# Patient Record
Sex: Female | Born: 1970 | Race: Black or African American | Hispanic: No | Marital: Single | Smoking: Never smoker
Health system: Southern US, Community
[De-identification: ages and names within clinical notes are randomized; demographics above are authoritative.]

## PROBLEM LIST (undated history)

## (undated) DIAGNOSIS — R112 Nausea with vomiting, unspecified: Secondary | ICD-10-CM

## (undated) DIAGNOSIS — Z9889 Other specified postprocedural states: Secondary | ICD-10-CM

## (undated) DIAGNOSIS — R011 Cardiac murmur, unspecified: Secondary | ICD-10-CM

## (undated) HISTORY — PX: ABDOMINAL HYSTERECTOMY: SHX81

## (undated) HISTORY — PX: CHOLECYSTECTOMY: SHX55

---

## 2000-01-31 HISTORY — PX: DILATION AND CURETTAGE OF UTERUS: SHX78

## 2000-01-31 HISTORY — PX: CHOLECYSTECTOMY, LAPAROSCOPIC: SHX56

## 2007-02-25 ENCOUNTER — Other Ambulatory Visit: Admission: RE | Admit: 2007-02-25 | Discharge: 2007-02-25 | Payer: Self-pay | Admitting: Obstetrics & Gynecology

## 2011-11-29 ENCOUNTER — Other Ambulatory Visit: Payer: Self-pay | Admitting: Obstetrics & Gynecology

## 2011-11-29 NOTE — Patient Instructions (Addendum)
22 Julie Vega  11/29/2011   Your procedure is scheduled on:   12/06/2011  Report to [facility] at  0900  AM.  Call this number if you have problems the morning of surgery: [phone]   Remember:   Do not eat food:After Midnight.  May have clear liquids:until Midnight .    Take these medicines the morning of surgery with A SIP OF WATER: none   Do not wear jewelry, make-up or nail polish.  Do not wear lotions, powders, or perfumes.   Do not shave 48 hours prior to surgery. Men may shave face and neck.  Do not bring valuables to the hospital.  Contacts, dentures or bridgework may not be worn into surgery.  Leave suitcase in the car. After surgery it may be brought to your room.  For patients admitted to the hospital, checkout time is 11:00 AM the day of discharge.   Patients discharged the day of surgery will not be allowed to drive home.  Name and phone number of your driver: family  Special Instructions: Shower using CHG 2 nights before surgery and the night before surgery.  If you shower the day of surgery use CHG.  Use special wash - you have one bottle of CHG for all showers.  You should use approximately 1/3 of the bottle for each shower.   Please read over the following fact sheets that you were given: Pain Booklet, Coughing and Deep Breathing, MRSA Information, Surgical Site Infection Prevention, Anesthesia Post-op Instructions and Care and Recovery After Surgery Supracervical Hysterectomy A supracervical hysterectomy is minimally invasive surgery to remove the top part of the uterus, but not the cervix. This surgery can be performed by making a large cut (incision) in the abdomen. It can also be done with a thin, lighted tube (laparoscope) inserted into 2 small incisions in the lower abdomen. Your fallopian tubes and ovaries can be removed (bilateral salpingo-oopherectomy) during this surgery as well. If a supracervical hysterectomy is started and it is not safe to  continue, the laparoscopic surgery will be converted to an open abdominal surgery. You will not have menstrual periods or be able to get pregnant after having this surgery. If a bilateral salpingo-oopherectomy was performed before menopause, you will go through a sudden (abrupt) menopause. This can be helped with hormone medicines. Benefits of minimally invasive surgery include:  Less pain.  Less risk of blood loss.  Less risk of infection.  Quicker return to normal activities.  Usually a 1 night stay in the hospital.  Overall patient satisfaction. LET YOUR CAREGIVER KNOW ABOUT:  Any history of abnormal Pap tests.  Allergies to food or medicine.  Medicines taken, including vitamins, herbs, eyedrops, over-the-counter medicines, and creams.  Use of steroids (by mouth or creams).  Previous problems with anesthetics or numbing medicines.  History of bleeding problems or blood clots.  Previous surgery.  Other health problems, including diabetes and kidney problems.  Any infections or colds you may have developed.  Symptoms of irregular or heavy periods, weight loss, or urinary or bowel changes. RISKS AND COMPLICATIONS   Bleeding.  Blood clots in the legs or lung.  Infection.  Injury to surrounding organs.  Problems with anesthesia.  Risk of conversion to an open abdominal incision.  Early menopause symptoms (hot flashes, night sweats, insomnia).  Additional surgery later to remove the cervix if you have problems with the cervix. BEFORE THE PROCEDURE  Ask your caregiver about changing or stopping your regular medicines.  Do not take aspirin or blood thinners (anticoagulants) for 1 week before the surgery, or as told by your caregiver.  Do not eat or drink anything for 8 hours before the surgery, or as told by your caregiver.  Quit smoking if you smoke.  Arrange for a ride home after surgery and for someone to help you at home during recovery. PROCEDURE    You will be given an antibiotic medicine.  An intravenous (IV) line will be placed in your arm. You will be given medicine to make you sleep (general anesthetic).  A gas (carbon dioxide) will be used to inflate your abdomen. This will allow your surgeon to look inside your abdomen, perform your surgery, and treat any other problems found if necessary.  Three or four small incisions (often less than  inch) will be made in your abdomen. One of these incisions will be made in the area of your belly button (navel). The laparoscope will be inserted into the incision. Your surgeon will look through the laparoscope while doing your procedure.  Other surgical instruments will be inserted through the other incisions.  The uterus will be cut into small pieces and removed through the small incisions.  Your incisions will be closed. AFTER THE PROCEDURE   The gas will be released from inside your abdomen.  You will be taken to the recovery area where a nurse will watch and check your progress. Once you are awake, stable, and taking fluids well, without other problems, you will return to your room or be allowed to go home.  There is usually minimal discomfort following the surgery because the incisions are so small.  You will be given pain medicine while you are in the hospital and for when you go home.  Try to have someone with you for the first 3 to 5 days after you go home.  Follow up with your surgeon in 2 to 4 weeks after surgery to evaluate your progress. Document Released: 07/05/2007 Document Revised: 04/10/2011 Document Reviewed: 09/02/2010 [facility] Patient Information 2013 [place], [place]. PATIENT INSTRUCTIONS POST-ANESTHESIA  IMMEDIATELY FOLLOWING SURGERY:  Do not drive or operate machinery for the first twenty four hours after surgery.  Do not make any important decisions for twenty four hours after surgery or while taking narcotic pain medications or sedatives.  If you develop  intractable nausea and vomiting or a severe headache please notify your doctor immediately.  FOLLOW-UP:  Please make an appointment with your surgeon as instructed. You do not need to follow up with anesthesia unless specifically instructed to do so.  WOUND CARE INSTRUCTIONS (if applicable):  Keep a dry clean dressing on the anesthesia/puncture wound site if there is drainage.  Once the wound has quit draining you may leave it open to air.  Generally you should leave the bandage intact for twenty four hours unless there is drainage.  If the epidural site drains for more than 36-48 hours please call the anesthesia department.  QUESTIONS?:  Please feel free to call your physician or the hospital operator if you have any questions, and they will be happy to assist you.

## 2011-11-30 ENCOUNTER — Encounter (HOSPITAL_COMMUNITY): Payer: Self-pay | Admitting: Pharmacy Technician

## 2011-11-30 ENCOUNTER — Encounter (HOSPITAL_COMMUNITY)
Admission: RE | Admit: 2011-11-30 | Discharge: 2011-11-30 | Disposition: A | Discharge status: Home / Self Care | Payer: Medicaid Other | Source: Ambulatory Visit | Attending: Obstetrics & Gynecology | Admitting: Obstetrics & Gynecology

## 2011-11-30 ENCOUNTER — Encounter (HOSPITAL_COMMUNITY): Payer: Self-pay

## 2011-11-30 DIAGNOSIS — Z01812 Encounter for preprocedural laboratory examination: Secondary | ICD-10-CM | POA: Insufficient documentation

## 2011-11-30 DIAGNOSIS — Z5320 Procedure and treatment not carried out because of patient's decision for unspecified reasons: Secondary | ICD-10-CM | POA: Insufficient documentation

## 2011-11-30 DIAGNOSIS — D259 Leiomyoma of uterus, unspecified: Secondary | ICD-10-CM | POA: Insufficient documentation

## 2011-11-30 HISTORY — DX: Nausea with vomiting, unspecified: R11.2

## 2011-11-30 HISTORY — DX: Cardiac murmur, unspecified: R01.1

## 2011-11-30 HISTORY — DX: Other specified postprocedural states: Z98.890

## 2011-11-30 LAB — CBC
Hemoglobin: 10.3 g/dL — ABNORMAL LOW (ref 12.0–15.0)
MCV: 72.1 fL — ABNORMAL LOW (ref 78.0–100.0)
Platelets: 400 10*3/uL (ref 150–400)
RBC: 4.58 MIL/uL (ref 3.87–5.11)
WBC: 5.3 10*3/uL (ref 4.0–10.5)

## 2011-11-30 LAB — COMPREHENSIVE METABOLIC PANEL
ALT: 12 U/L (ref 0–35)
AST: 17 U/L (ref 0–37)
CO2: 27 mEq/L (ref 19–32)
Chloride: 99 mEq/L (ref 96–112)
GFR calc non Af Amer: >90 mL/min (ref >90)
Sodium: 135 mEq/L (ref 135–145)
Total Bilirubin: 0.2 mg/dL — ABNORMAL LOW (ref 0.3–1.2)

## 2011-11-30 LAB — SURGICAL PCR SCREEN: MRSA, PCR: NEGATIVE

## 2011-11-30 LAB — URINALYSIS, ROUTINE W REFLEX MICROSCOPIC
Bilirubin Urine: NEGATIVE
Glucose, UA: NEGATIVE mg/dL
Hgb urine dipstick: NEGATIVE
Ketones, ur: NEGATIVE mg/dL
Protein, ur: NEGATIVE mg/dL

## 2011-12-01 NOTE — Progress Notes (Signed)
Dr. Jayme Cloud aware of low Hgb & pt. Being Jehovah's Witness. Dr. Despina Hidden notified & spoke with Dr. Jayme Cloud regarding pt.

## 2011-12-05 NOTE — OR Nursing (Signed)
Surgery was cancelled by Dr. Despina Hidden inregards to lab values, and pt. Refusing blood products if needed.

## 2011-12-06 ENCOUNTER — Ambulatory Visit (HOSPITAL_COMMUNITY)
Admission: RE | Admit: 2011-12-06 | Payer: Medicaid Other | Source: Ambulatory Visit | Admitting: Obstetrics & Gynecology

## 2011-12-06 SURGERY — HYSTERECTOMY, SUPRACERVICAL, ABDOMINAL
Anesthesia: General

## 2012-01-12 ENCOUNTER — Encounter (HOSPITAL_COMMUNITY): Payer: Self-pay | Admitting: Pharmacy Technician

## 2012-01-30 NOTE — Patient Instructions (Addendum)
22 Julie Vega  01/30/2012   Your procedure is scheduled on:  02/07/2012   Report to [facility] at  930  AM.  Call this number if you have problems the morning of surgery: [phone]   Remember:   Do not eat food:After Midnight.  May have clear liquids:until Midnight .    Take these medicines the morning of surgery with A SIP OF WATER:  none   Do not wear jewelry, make-up or nail polish.  Do not wear lotions, powders, or perfumes.  Do not shave 48 hours prior to surgery. Men may shave face and neck.  Do not bring valuables to the hospital.  Contacts, dentures or bridgework may not be worn into surgery.  Leave suitcase in the car. After surgery it may be brought to your room.  For patients admitted to the hospital, checkout time is 11:00 AM the day of discharge.   Patients discharged the day of surgery will not be allowed to drive home.  Name and phone number of your driver: family  Special Instructions: Shower using CHG 2 nights before surgery and the night before surgery.  If you shower the day of surgery use CHG.  Use special wash - you have one bottle of CHG for all showers.  You should use approximately 1/3 of the bottle for each shower.   Please read over the following fact sheets that you were given: Pain Booklet, Coughing and Deep Breathing, MRSA Information, Surgical Site Infection Prevention, Anesthesia Post-op Instructions and Care and Recovery After Surgery Supracervical Hysterectomy A supracervical hysterectomy is minimally invasive surgery to remove the top part of the uterus, but not the cervix. This surgery can be performed by making a large cut (incision) in the abdomen. It can also be done with a thin, lighted tube (laparoscope) inserted into 2 small incisions in the lower abdomen. Your fallopian tubes and ovaries can be removed (bilateral salpingo-oopherectomy) during this surgery as well. If a supracervical hysterectomy is started and it is not safe to  continue, the laparoscopic surgery will be converted to an open abdominal surgery. You will not have menstrual periods or be able to get pregnant after having this surgery. If a bilateral salpingo-oopherectomy was performed before menopause, you will go through a sudden (abrupt) menopause. This can be helped with hormone medicines. Benefits of minimally invasive surgery include:  Less pain.  Less risk of blood loss.  Less risk of infection.  Quicker return to normal activities.  Usually a 1 night stay in the hospital.  Overall patient satisfaction. LET YOUR CAREGIVER KNOW ABOUT:  Any history of abnormal Pap tests.  Allergies to food or medicine.  Medicines taken, including vitamins, herbs, eyedrops, over-the-counter medicines, and creams.  Use of steroids (by mouth or creams).  Previous problems with anesthetics or numbing medicines.  History of bleeding problems or blood clots.  Previous surgery.  Other health problems, including diabetes and kidney problems.  Any infections or colds you may have developed.  Symptoms of irregular or heavy periods, weight loss, or urinary or bowel changes. RISKS AND COMPLICATIONS   Bleeding.  Blood clots in the legs or lung.  Infection.  Injury to surrounding organs.  Problems with anesthesia.  Risk of conversion to an open abdominal incision.  Early menopause symptoms (hot flashes, night sweats, insomnia).  Additional surgery later to remove the cervix if you have problems with the cervix. BEFORE THE PROCEDURE  Ask your caregiver about changing or stopping your regular  medicines.  Do not take aspirin or blood thinners (anticoagulants) for 1 week before the surgery, or as told by your caregiver.  Do not eat or drink anything for 8 hours before the surgery, or as told by your caregiver.  Quit smoking if you smoke.  Arrange for a ride home after surgery and for someone to help you at home during recovery. PROCEDURE    You will be given an antibiotic medicine.  An intravenous (IV) line will be placed in your arm. You will be given medicine to make you sleep (general anesthetic).  A gas (carbon dioxide) will be used to inflate your abdomen. This will allow your surgeon to look inside your abdomen, perform your surgery, and treat any other problems found if necessary.  Three or four small incisions (often less than  inch) will be made in your abdomen. One of these incisions will be made in the area of your belly button (navel). The laparoscope will be inserted into the incision. Your surgeon will look through the laparoscope while doing your procedure.  Other surgical instruments will be inserted through the other incisions.  The uterus will be cut into small pieces and removed through the small incisions.  Your incisions will be closed. AFTER THE PROCEDURE   The gas will be released from inside your abdomen.  You will be taken to the recovery area where a nurse will watch and check your progress. Once you are awake, stable, and taking fluids well, without other problems, you will return to your room or be allowed to go home.  There is usually minimal discomfort following the surgery because the incisions are so small.  You will be given pain medicine while you are in the hospital and for when you go home.  Try to have someone with you for the first 3 to 5 days after you go home.  Follow up with your surgeon in 2 to 4 weeks after surgery to evaluate your progress. Document Released: 07/05/2007 Document Revised: 04/10/2011 Document Reviewed: 09/02/2010 [facility] Patient Information 2013 [place], [place]. PATIENT INSTRUCTIONS POST-ANESTHESIA  IMMEDIATELY FOLLOWING SURGERY:  Do not drive or operate machinery for the first twenty four hours after surgery.  Do not make any important decisions for twenty four hours after surgery or while taking narcotic pain medications or sedatives.  If you develop  intractable nausea and vomiting or a severe headache please notify your doctor immediately.  FOLLOW-UP:  Please make an appointment with your surgeon as instructed. You do not need to follow up with anesthesia unless specifically instructed to do so.  WOUND CARE INSTRUCTIONS (if applicable):  Keep a dry clean dressing on the anesthesia/puncture wound site if there is drainage.  Once the wound has quit draining you may leave it open to air.  Generally you should leave the bandage intact for twenty four hours unless there is drainage.  If the epidural site drains for more than 36-48 hours please call the anesthesia department.  QUESTIONS?:  Please feel free to call your physician or the hospital operator if you have any questions, and they will be happy to assist you.

## 2012-02-01 ENCOUNTER — Encounter (HOSPITAL_COMMUNITY): Payer: Self-pay

## 2012-02-01 ENCOUNTER — Encounter (HOSPITAL_COMMUNITY)
Admission: RE | Admit: 2012-02-01 | Discharge: 2012-02-01 | Disposition: A | Discharge status: Home / Self Care | Payer: Medicaid Other | Source: Ambulatory Visit | Attending: Obstetrics & Gynecology | Admitting: Obstetrics & Gynecology

## 2012-02-01 LAB — URINALYSIS, ROUTINE W REFLEX MICROSCOPIC
Bilirubin Urine: NEGATIVE
Glucose, UA: NEGATIVE mg/dL
Ketones, ur: NEGATIVE mg/dL
Leukocytes, UA: NEGATIVE
Protein, ur: NEGATIVE mg/dL

## 2012-02-01 LAB — CBC
HCT: 36.7 % (ref 36.0–46.0)
Hemoglobin: 12 g/dL (ref 12.0–15.0)
RDW: 18.8 % — ABNORMAL HIGH (ref 11.5–15.5)
WBC: 4.8 10*3/uL (ref 4.0–10.5)

## 2012-02-01 LAB — COMPREHENSIVE METABOLIC PANEL
Albumin: 3.5 g/dL (ref 3.5–5.2)
Alkaline Phosphatase: 60 U/L (ref 39–117)
BUN: 10 mg/dL (ref 6–23)
Chloride: 106 mEq/L (ref 96–112)
Potassium: 4.2 mEq/L (ref 3.5–5.1)
Total Bilirubin: 0.4 mg/dL (ref 0.3–1.2)

## 2012-02-01 LAB — SURGICAL PCR SCREEN: Staphylococcus aureus: POSITIVE — AB

## 2012-02-01 LAB — URINE MICROSCOPIC-ADD ON

## 2012-02-01 NOTE — Pre-Procedure Instructions (Signed)
Dr Jayme Cloud aware that patient is Jehovah's Witness and also aware of Hgb and Hct. No further orders given. Refusal for blood products consent signed by patient and is on chart.

## 2012-02-07 ENCOUNTER — Encounter (HOSPITAL_COMMUNITY)
Admission: RE | Disposition: A | Discharge status: Home / Self Care | Payer: Self-pay | Source: Ambulatory Visit | Attending: Obstetrics & Gynecology

## 2012-02-07 ENCOUNTER — Observation Stay (HOSPITAL_COMMUNITY)
Admission: RE | Admit: 2012-02-07 | Discharge: 2012-02-08 | Disposition: A | Discharge status: Home / Self Care | Payer: Medicaid Other | Source: Ambulatory Visit | Attending: Obstetrics & Gynecology | Admitting: Obstetrics & Gynecology

## 2012-02-07 ENCOUNTER — Encounter (HOSPITAL_COMMUNITY): Payer: Self-pay | Admitting: Anesthesiology

## 2012-02-07 ENCOUNTER — Ambulatory Visit (HOSPITAL_COMMUNITY): Payer: Medicaid Other | Admitting: Anesthesiology

## 2012-02-07 ENCOUNTER — Encounter (HOSPITAL_COMMUNITY): Payer: Self-pay | Admitting: *Deleted

## 2012-02-07 DIAGNOSIS — N920 Excessive and frequent menstruation with regular cycle: Secondary | ICD-10-CM | POA: Insufficient documentation

## 2012-02-07 DIAGNOSIS — Z90711 Acquired absence of uterus with remaining cervical stump: Secondary | ICD-10-CM

## 2012-02-07 DIAGNOSIS — D259 Leiomyoma of uterus, unspecified: Principal | ICD-10-CM | POA: Insufficient documentation

## 2012-02-07 DIAGNOSIS — D649 Anemia, unspecified: Secondary | ICD-10-CM | POA: Insufficient documentation

## 2012-02-07 DIAGNOSIS — Z01812 Encounter for preprocedural laboratory examination: Secondary | ICD-10-CM | POA: Insufficient documentation

## 2012-02-07 HISTORY — PX: SUPRACERVICAL ABDOMINAL HYSTERECTOMY: SHX5393

## 2012-02-07 SURGERY — HYSTERECTOMY, SUPRACERVICAL, ABDOMINAL
Anesthesia: General | Site: Abdomen | Wound class: Clean

## 2012-02-07 MED ORDER — DOCUSATE SODIUM 100 MG PO CAPS
100.0000 mg | ORAL_CAPSULE | Freq: Two times a day (BID) | ORAL | Status: DC
  Administered 2012-02-07 – 2012-02-08 (×2): 100 mg via ORAL
  Filled 2012-02-07 (×2): qty 1

## 2012-02-07 MED ORDER — CEFAZOLIN SODIUM-DEXTROSE 2-3 GM-% IV SOLR
INTRAVENOUS | Status: AC
  Filled 2012-02-07: qty 50

## 2012-02-07 MED ORDER — GLYCOPYRROLATE 0.2 MG/ML IJ SOLN
INTRAMUSCULAR | Status: DC | PRN
  Administered 2012-02-07: 0.4 mg via INTRAVENOUS

## 2012-02-07 MED ORDER — ROCURONIUM BROMIDE 50 MG/5ML IV SOLN
INTRAVENOUS | Status: AC
  Filled 2012-02-07: qty 1

## 2012-02-07 MED ORDER — SODIUM CHLORIDE 0.9 % IR SOLN
Status: DC | PRN
  Administered 2012-02-07 (×3): 1000 mL

## 2012-02-07 MED ORDER — DEXAMETHASONE SODIUM PHOSPHATE 4 MG/ML IJ SOLN
INTRAMUSCULAR | Status: AC
  Filled 2012-02-07: qty 1

## 2012-02-07 MED ORDER — MIDAZOLAM HCL 5 MG/5ML IJ SOLN
INTRAMUSCULAR | Status: DC | PRN
  Administered 2012-02-07: 2 mg via INTRAVENOUS

## 2012-02-07 MED ORDER — SIMETHICONE 80 MG PO CHEW: 80.0000 mg | CHEWABLE_TABLET | Freq: Four times a day (QID) | ORAL | Status: DC | PRN

## 2012-02-07 MED ORDER — ROCURONIUM BROMIDE 100 MG/10ML IV SOLN
INTRAVENOUS | Status: DC | PRN
  Administered 2012-02-07: 50 mg via INTRAVENOUS
  Administered 2012-02-07: 10 mg via INTRAVENOUS

## 2012-02-07 MED ORDER — ZOLPIDEM TARTRATE 5 MG PO TABS: 5.0000 mg | ORAL_TABLET | Freq: Every evening | ORAL | Status: DC | PRN

## 2012-02-07 MED ORDER — MIDAZOLAM HCL 2 MG/2ML IJ SOLN
1.0000 mg | INTRAMUSCULAR | Status: DC | PRN
  Administered 2012-02-07: 2 mg via INTRAVENOUS

## 2012-02-07 MED ORDER — NEOSTIGMINE METHYLSULFATE 1 MG/ML IJ SOLN
INTRAMUSCULAR | Status: AC
  Filled 2012-02-07: qty 1

## 2012-02-07 MED ORDER — ONDANSETRON HCL 4 MG/2ML IJ SOLN
4.0000 mg | Freq: Once | INTRAMUSCULAR | Status: AC | PRN
  Administered 2012-02-07: 4 mg via INTRAVENOUS

## 2012-02-07 MED ORDER — FENTANYL CITRATE 0.05 MG/ML IJ SOLN
INTRAMUSCULAR | Status: AC
  Filled 2012-02-07: qty 5

## 2012-02-07 MED ORDER — FENTANYL CITRATE 0.05 MG/ML IJ SOLN
INTRAMUSCULAR | Status: DC | PRN
  Administered 2012-02-07: 50 ug via INTRAVENOUS
  Administered 2012-02-07: 100 ug via INTRAVENOUS
  Administered 2012-02-07 (×4): 50 ug via INTRAVENOUS
  Administered 2012-02-07: 150 ug via INTRAVENOUS

## 2012-02-07 MED ORDER — PROPOFOL 10 MG/ML IV BOLUS
INTRAVENOUS | Status: DC | PRN
  Administered 2012-02-07: 150 mg via INTRAVENOUS

## 2012-02-07 MED ORDER — NEOSTIGMINE METHYLSULFATE 1 MG/ML IJ SOLN
INTRAMUSCULAR | Status: DC | PRN
  Administered 2012-02-07: 3 mg via INTRAVENOUS

## 2012-02-07 MED ORDER — ONDANSETRON HCL 4 MG/2ML IJ SOLN
INTRAMUSCULAR | Status: AC
  Filled 2012-02-07: qty 2

## 2012-02-07 MED ORDER — CEFAZOLIN SODIUM-DEXTROSE 2-3 GM-% IV SOLR
INTRAVENOUS | Status: DC | PRN
  Administered 2012-02-07: 2 g via INTRAVENOUS

## 2012-02-07 MED ORDER — MIDAZOLAM HCL 2 MG/2ML IJ SOLN
INTRAMUSCULAR | Status: AC
  Filled 2012-02-07: qty 2

## 2012-02-07 MED ORDER — GLYCOPYRROLATE 0.2 MG/ML IJ SOLN
INTRAMUSCULAR | Status: AC
  Filled 2012-02-07: qty 2

## 2012-02-07 MED ORDER — LIDOCAINE HCL 1 % IJ SOLN
INTRAMUSCULAR | Status: DC | PRN
  Administered 2012-02-07: 50 mg via INTRADERMAL

## 2012-02-07 MED ORDER — OXYCODONE-ACETAMINOPHEN 5-325 MG PO TABS
1.0000 | ORAL_TABLET | ORAL | Status: DC | PRN
Start: 2012-02-07 — End: 2012-02-08
  Administered 2012-02-07: 2 via ORAL
  Filled 2012-02-07 (×2): qty 2
  Filled 2012-02-07: qty 1

## 2012-02-07 MED ORDER — ONDANSETRON 8 MG/NS 50 ML IVPB
8.0000 mg | Freq: Four times a day (QID) | INTRAVENOUS | Status: DC | PRN
  Filled 2012-02-07: qty 8

## 2012-02-07 MED ORDER — FENTANYL CITRATE 0.05 MG/ML IJ SOLN
INTRAMUSCULAR | Status: AC
  Filled 2012-02-07: qty 2

## 2012-02-07 MED ORDER — ONDANSETRON HCL 4 MG/2ML IJ SOLN
4.0000 mg | Freq: Once | INTRAMUSCULAR | Status: AC
  Administered 2012-02-07: 4 mg via INTRAVENOUS

## 2012-02-07 MED ORDER — CEFAZOLIN SODIUM-DEXTROSE 2-3 GM-% IV SOLR: 2.0000 g | INTRAVENOUS | Status: DC

## 2012-02-07 MED ORDER — FENTANYL CITRATE 0.05 MG/ML IJ SOLN
25.0000 ug | INTRAMUSCULAR | Status: DC | PRN
  Administered 2012-02-07 (×2): 50 ug via INTRAVENOUS

## 2012-02-07 MED ORDER — LACTATED RINGERS IV SOLN
INTRAVENOUS | Status: DC
  Administered 2012-02-07 (×2): via INTRAVENOUS
  Administered 2012-02-07: 1000 mL via INTRAVENOUS

## 2012-02-07 MED ORDER — LIDOCAINE HCL (PF) 1 % IJ SOLN
INTRAMUSCULAR | Status: AC
  Filled 2012-02-07: qty 5

## 2012-02-07 MED ORDER — ALUM & MAG HYDROXIDE-SIMETH 200-200-20 MG/5ML PO SUSP: 30.0000 mL | ORAL | Status: DC | PRN

## 2012-02-07 MED ORDER — KETOROLAC TROMETHAMINE 30 MG/ML IJ SOLN
30.0000 mg | Freq: Once | INTRAMUSCULAR | Status: AC
  Administered 2012-02-07: 30 mg via INTRAVENOUS

## 2012-02-07 MED ORDER — ONDANSETRON HCL 4 MG PO TABS
8.0000 mg | ORAL_TABLET | Freq: Four times a day (QID) | ORAL | Status: DC | PRN
  Administered 2012-02-07: 8 mg via ORAL
  Filled 2012-02-07: qty 2

## 2012-02-07 MED ORDER — PROPOFOL 10 MG/ML IV EMUL
INTRAVENOUS | Status: AC
  Filled 2012-02-07: qty 20

## 2012-02-07 MED ORDER — HYDROMORPHONE HCL PF 1 MG/ML IJ SOLN
1.0000 mg | INTRAMUSCULAR | Status: DC | PRN
  Administered 2012-02-07 – 2012-02-08 (×5): 1 mg via INTRAVENOUS
  Filled 2012-02-07 (×5): qty 1

## 2012-02-07 MED ORDER — DEXAMETHASONE SODIUM PHOSPHATE 4 MG/ML IJ SOLN
4.0000 mg | Freq: Once | INTRAMUSCULAR | Status: AC
  Administered 2012-02-07: 4 mg via INTRAVENOUS

## 2012-02-07 MED ORDER — KETOROLAC TROMETHAMINE 30 MG/ML IJ SOLN
INTRAMUSCULAR | Status: AC
  Filled 2012-02-07: qty 1

## 2012-02-07 MED ORDER — KCL IN DEXTROSE-NACL 40-5-0.45 MEQ/L-%-% IV SOLN
INTRAVENOUS | Status: DC
  Administered 2012-02-07: 1000 mL via INTRAVENOUS
  Administered 2012-02-07: 13:00:00 via INTRAVENOUS
  Administered 2012-02-08: 1000 mL via INTRAVENOUS

## 2012-02-07 SURGICAL SUPPLY — 53 items
ADH SKN CLS APL DERMABOND .7 (GAUZE/BANDAGES/DRESSINGS) ×2
APPLIER CLIP 11 MED OPEN (CLIP) ×2
APPLIER CLIP 13 LRG OPEN (CLIP)
APR CLP LRG 13 20 CLIP (CLIP)
APR CLP MED 11 20 MLT OPN (CLIP) ×1
BAG HAMPER (MISCELLANEOUS) ×2 IMPLANT
BRR ADH 6X5 SEPRAFILM 1 SHT (MISCELLANEOUS)
CELLS DAT CNTRL 66122 CELL SVR (MISCELLANEOUS) IMPLANT
CLIP APPLIE 11 MED OPEN (CLIP) IMPLANT
CLIP APPLIE 13 LRG OPEN (CLIP) IMPLANT
CLOTH BEACON ORANGE TIMEOUT ST (SAFETY) ×2 IMPLANT
COVER LIGHT HANDLE STERIS (MISCELLANEOUS) ×4 IMPLANT
DERMABOND ADVANCED (GAUZE/BANDAGES/DRESSINGS) ×2
DERMABOND ADVANCED .7 DNX12 (GAUZE/BANDAGES/DRESSINGS) ×1 IMPLANT
DRAPE WARM FLUID 44X44 (DRAPE) ×2 IMPLANT
DRESSING TELFA 8X3 (GAUZE/BANDAGES/DRESSINGS) ×2 IMPLANT
ELECT REM PT RETURN 9FT ADLT (ELECTROSURGICAL) ×2
ELECTRODE REM PT RTRN 9FT ADLT (ELECTROSURGICAL) ×1 IMPLANT
FORMALIN 10 PREFIL 480ML (MISCELLANEOUS) ×2 IMPLANT
GLOVE BIOGEL PI IND STRL 7.0 (GLOVE) IMPLANT
GLOVE BIOGEL PI IND STRL 8 (GLOVE) ×1 IMPLANT
GLOVE BIOGEL PI INDICATOR 7.0 (GLOVE) ×3
GLOVE BIOGEL PI INDICATOR 8 (GLOVE) ×1
GLOVE ECLIPSE 6.5 STRL STRAW (GLOVE) ×3 IMPLANT
GLOVE ECLIPSE 8.0 STRL XLNG CF (GLOVE) ×2 IMPLANT
GLOVE EXAM NITRILE MD LF STRL (GLOVE) ×2 IMPLANT
GOWN SRG XL XLNG 56XLVL 4 (GOWN DISPOSABLE) ×1 IMPLANT
GOWN STRL NON-REIN XL XLG LVL4 (GOWN DISPOSABLE) ×2
GOWN STRL REIN XL XLG (GOWN DISPOSABLE) ×4 IMPLANT
INST SET MAJOR GENERAL (KITS) ×2 IMPLANT
KIT ROOM TURNOVER APOR (KITS) ×2 IMPLANT
MANIFOLD NEPTUNE II (INSTRUMENTS) ×2 IMPLANT
NS IRRIG 1000ML POUR BTL (IV SOLUTION) ×5 IMPLANT
PACK ABDOMINAL MAJOR (CUSTOM PROCEDURE TRAY) ×2 IMPLANT
PAD ARMBOARD 7.5X6 YLW CONV (MISCELLANEOUS) ×2 IMPLANT
RETRACTOR WND ALEXIS 18 MED (MISCELLANEOUS) IMPLANT
RETRACTOR WND ALEXIS 25 LRG (MISCELLANEOUS) IMPLANT
RTRCTR WOUND ALEXIS 18CM MED (MISCELLANEOUS)
RTRCTR WOUND ALEXIS 25CM LRG (MISCELLANEOUS) ×2
SEPRAFILM MEMBRANE 5X6 (MISCELLANEOUS) ×1 IMPLANT
SET BASIN LINEN APH (SET/KITS/TRAYS/PACK) ×2 IMPLANT
STAPLER VISISTAT 35W (STAPLE) ×2 IMPLANT
SUT CHROMIC 0 CT 1 (SUTURE) ×2 IMPLANT
SUT MON AB 3-0 SH 27 (SUTURE) ×1 IMPLANT
SUT PLAIN 2 0 XLH (SUTURE) ×1 IMPLANT
SUT VIC AB 0 CT1 27 (SUTURE) ×8
SUT VIC AB 0 CT1 27XBRD ANTBC (SUTURE) ×1 IMPLANT
SUT VIC AB 0 CT1 27XCR 8 STRN (SUTURE) ×2 IMPLANT
SUT VIC AB 0 CTX 36 (SUTURE)
SUT VIC AB 0 CTX36XBRD ANTBCTR (SUTURE) ×1 IMPLANT
SUT VICRYL 3 0 (SUTURE) ×1 IMPLANT
TOWEL BLUE STERILE X RAY DET (MISCELLANEOUS) ×1 IMPLANT
TRAY FOLEY CATH 14FR (SET/KITS/TRAYS/PACK) ×2 IMPLANT

## 2012-02-07 NOTE — Anesthesia Procedure Notes (Signed)
Procedure Name: Intubation Date/Time: 02/07/2012 8:52 AM Performed by: Despina Hidden Pre-anesthesia Checklist: Emergency Drugs available, Suction available, Patient being monitored and Patient identified Patient Re-evaluated:Patient Re-evaluated prior to inductionOxygen Delivery Method: Circle system utilized Preoxygenation: Pre-oxygenation with 100% oxygen Intubation Type: IV induction Ventilation: Mask ventilation without difficulty Laryngoscope Size: Mac and 3 Grade View: Grade I Tube type: Oral Tube size: 7.0 mm Number of attempts: 1 Airway Equipment and Method: Stylet Placement Confirmation: ETT inserted through vocal cords under direct vision,  positive ETCO2 and breath sounds checked- equal and bilateral Secured at: 22 cm Tube secured with: Tape Dental Injury: Teeth and Oropharynx as per pre-operative assessment

## 2012-02-07 NOTE — Transfer of Care (Signed)
Immediate Anesthesia Transfer of Care Note  Patient: Julie Vega  Procedure(s) Performed: Procedure(s) (LRB) with comments: HYSTERECTOMY SUPRACERVICAL ABDOMINAL (N/A)  Patient Location: PACU  Anesthesia Type:General  Level of Consciousness: awake and patient cooperative  Airway & Oxygen Therapy: Patient Spontanous Breathing and Patient connected to face mask oxygen  Post-op Assessment: Report given to PACU RN, Post -op Vital signs reviewed and stable and Patient moving all extremities  Post vital signs: Reviewed and stable  Complications: No apparent anesthesia complications

## 2012-02-07 NOTE — H&P (Signed)
Julie Vega is an 42 y.o. female  Multiparous with 18 weeks size fibroids and originally presented with severe anemia, hemoglobin 8, we have worked for 4 months to increase her blood counts.  She has pelvic abdominal pain and menmetrorrhagia.  She is admitted for supracervical hysterectomy, abdominal; approach.     Past Medical History  Diagnosis Date  . Heart murmur   . PONV (postoperative nausea and vomiting)     Past Surgical History  Procedure Date  . Cholecystectomy, laparoscopic 2002  . Dilation and curettage of uterus 2002  . Breast excisional biopsy 1989  . Dilation and curettage of uterus 2002  . Cholecystectomy     Family History  Problem Relation Age of Onset  . Cancer - Colon Mother   . Diabetes type II Father   . Hypertension Father     Social History:  reports that she has never smoked. She does not have any smokeless tobacco history on file. She reports that she does not drink alcohol or use illicit drugs.  Allergies: No Known Allergies  Prescriptions prior to admission  Medication Sig Dispense Refill  . ferrous sulfate 325 (65 FE) MG tablet Take 325 mg by mouth daily with breakfast.      . naproxen sodium (ANAPROX) 220 MG tablet Take 220 mg by mouth daily as needed. For headaches      . Multiple Vitamins-Calcium (ONE-A-DAY WOMENS PO) Take 2 tablets by mouth daily.        ROS  Review of Systems  Constitutional: Negative for fever, chills, weight loss, malaise/fatigue and diaphoresis.  HENT: Negative for hearing loss, ear pain, nosebleeds, congestion, sore throat, neck pain, tinnitus and ear discharge.   Eyes: Negative for blurred vision, double vision, photophobia, pain, discharge and redness.  Respiratory: Negative for cough, hemoptysis, sputum production, shortness of breath, wheezing and stridor.   Cardiovascular: Negative for chest pain, palpitations, orthopnea, claudication, leg swelling and PND.  Gastrointestinal: Positive for abdominal pain.  Negative for heartburn, nausea, vomiting, diarrhea, constipation, blood in stool and melena.  Genitourinary: Negative for dysuria, urgency, frequency, hematuria and flank pain.  Musculoskeletal: Negative for myalgias, back pain, joint pain and falls.  Skin: Negative for itching and rash.  Neurological: Negative for dizziness, tingling, tremors, sensory change, speech change, focal weakness, seizures, loss of consciousness, weakness and headaches.  Endo/Heme/Allergies: Negative for environmental allergies and polydipsia. Does not bruise/bleed easily.  Psychiatric/Behavioral: Negative for depression, suicidal ideas, hallucinations, memory loss and substance abuse. The patient is not nervous/anxious and does not have insomnia.     Blood pressure 117/76, pulse 76, temperature 97.4 F (36.3 C), temperature source Oral, resp. rate 18, SpO2 98.00%. Physical Exam Physical Exam  Vitals reviewed. Constitutional: She is oriented to person, place, and time. She appears well-developed and well-nourished.  HENT:  Head: Normocephalic and atraumatic.  Right Ear: External ear normal.  Left Ear: External ear normal.  Nose: Nose normal.  Mouth/Throat: Oropharynx is clear and moist.  Eyes: Conjunctivae and EOM are normal. Pupils are equal, round, and reactive to light. Right eye exhibits no discharge. Left eye exhibits no discharge. No scleral icterus.  Neck: Normal range of motion. Neck supple. No tracheal deviation present. No thyromegaly present.  Cardiovascular: Normal rate, regular rhythm, normal heart sounds and intact distal pulses.  Exam reveals no gallop and no friction rub.   No murmur heard. Respiratory: Effort normal and breath sounds normal. No respiratory distress. She has no wheezes. She has no rales. She exhibits no tenderness.  GI: Soft. Bowel sounds are normal. She exhibits no distension and no mass. There is tenderness. There is no rebound and no guarding.  Genitourinary:       Vulva is  normal without lesions Vagina is pink moist without discharge Cervix normal in appearance and pap is normal Uterus is enlarged to 16-18 weeks size due to multiple fibroids  Adnexa is negative with normal sized ovaries by sonogram  Musculoskeletal: Normal range of motion. She exhibits no edema and no tenderness.  Neurological: She is alert and oriented to person, place, and time. She has normal reflexes. She displays normal reflexes. No cranial nerve deficit. She exhibits normal muscle tone. Coordination normal.  Skin: Skin is warm and dry. No rash noted. No erythema. No pallor.  Psychiatric: She has a normal mood and affect. Her behavior is normal. Judgment and thought content normal.    Recent Results (from the past 336 hour(s))  CBC   Collection Time   02/01/12  1:00 PM      Component Value Range   WBC 4.8  4.0 - 10.5 K/uL   RBC 4.62  3.87 - 5.11 MIL/uL   Hemoglobin 12.0  12.0 - 15.0 g/dL   HCT 46.9  62.9 - 52.8 %   MCV 79.4  78.0 - 100.0 fL   MCH 26.0  26.0 - 34.0 pg   MCHC 32.7  30.0 - 36.0 g/dL   RDW 41.3 (*) 24.4 - 01.0 %   Platelets 290  150 - 400 K/uL  COMPREHENSIVE METABOLIC PANEL   Collection Time   02/01/12  1:00 PM      Component Value Range   Sodium 138  135 - 145 mEq/L   Potassium 4.2  3.5 - 5.1 mEq/L   Chloride 106  96 - 112 mEq/L   CO2 26  19 - 32 mEq/L   Glucose, Bld 66 (*) 70 - 99 mg/dL   BUN 10  6 - 23 mg/dL   Creatinine, Ser 2.72  0.50 - 1.10 mg/dL   Calcium 9.6  8.4 - 53.6 mg/dL   Total Protein 6.8  6.0 - 8.3 g/dL   Albumin 3.5  3.5 - 5.2 g/dL   AST 20  0 - 37 U/L   ALT 23  0 - 35 U/L   Alkaline Phosphatase 60  39 - 117 U/L   Total Bilirubin 0.4  0.3 - 1.2 mg/dL   GFR calc non Af Amer >90  >90 mL/min   GFR calc Af Amer >90  >90 mL/min  URINALYSIS, ROUTINE W REFLEX MICROSCOPIC   Collection Time   02/01/12  1:00 PM      Component Value Range   Color, Urine YELLOW  YELLOW   APPearance CLEAR  CLEAR   Specific Gravity, Urine >1.030 (*) 1.005 - 1.030   pH  6.0  5.0 - 8.0   Glucose, UA NEGATIVE  NEGATIVE mg/dL   Hgb urine dipstick SMALL (*) NEGATIVE   Bilirubin Urine NEGATIVE  NEGATIVE   Ketones, ur NEGATIVE  NEGATIVE mg/dL   Protein, ur NEGATIVE  NEGATIVE mg/dL   Urobilinogen, UA 0.2  0.0 - 1.0 mg/dL   Nitrite NEGATIVE  NEGATIVE   Leukocytes, UA NEGATIVE  NEGATIVE  HCG, SERUM, QUALITATIVE   Collection Time   02/01/12  1:00 PM      Component Value Range   Preg, Serum NEGATIVE  NEGATIVE  URINE MICROSCOPIC-ADD ON   Collection Time   02/01/12  1:00 PM      Component  Value Range   Squamous Epithelial / LPF RARE  RARE   WBC, UA 0-2  <3 WBC/hpf   RBC / HPF 0-2  <3 RBC/hpf  SURGICAL PCR SCREEN   Collection Time   02/01/12  1:15 PM      Component Value Range   MRSA, PCR NEGATIVE  NEGATIVE   Staphylococcus aureus POSITIVE (*) NEGATIVE       Assessment/Plan: 1.Enlarged fibroid uterus 2.  Menometrorrhagia 3.  Anemia, resolved with megestrol and iron 4.  Jehovah's witness  Patient aware of her risk of excessive bleeding from surgery but she declines all blood products.  Pt understands the risks of surgery including but not limited t  excessive bleeding requiring transfusion or reoperation, post-operative infection requiring prolonged hospitalization or re-hospitalization and antibiotic therapy, and damage to other organs including bladder, bowel, ureters and major vessels.  The patient also understands the alternative treatment options which were discussed in full.  All questions were answered.   Azalea Cedar H 02/07/2012, 8:22 AM

## 2012-02-07 NOTE — Anesthesia Postprocedure Evaluation (Signed)
  Anesthesia Post-op Note  Patient: Julie Vega  Procedure(s) Performed: Procedure(s) (LRB) with comments: HYSTERECTOMY SUPRACERVICAL ABDOMINAL (N/A)  Patient Location: PACU  Anesthesia Type:General  Level of Consciousness: awake, alert , oriented and patient cooperative  Airway and Oxygen Therapy: Patient Spontanous Breathing  Post-op Pain: 5 /10, moderate  Post-op Assessment: Post-op Vital signs reviewed, Patient's Cardiovascular Status Stable, Respiratory Function Stable, Patent Airway, No signs of Nausea or vomiting and Pain level controlled  Post-op Vital Signs: Reviewed and stable  Complications: No apparent anesthesia complications

## 2012-02-07 NOTE — Op Note (Signed)
Preoperative diagnosis:  1.  18 week size fibroid uterus                                          2.  Menometrorrhagia                                         3.  Anemia, resolved on aggressive megace and iron therapy                                         4.  Jehovah's [employer]  Postoperative diagnosis:  Same as above   Procedure:  Abdominal hysterectomy, supracervical  Surgeon:  Lazaro Arms  Assistant:    Anesthesia:  General endotracheal  Preoperative clinical summary:  Enlarged painful fibroid uterus, 18 week size, anemia as a result of heavy prolonged menstrual periods                                       Intraoperative findings: same, normal ovaries, normal intra peritoneal cavity  Description of operation:  Patient was taken to the operating room and placed in the supine position where she underwent general endotracheal anesthesia.  She was then prepped and draped in the usual sterile fashion and a Foley catheter was placed for continuous bladder drainage.  A Pfannenstiel skin incision was made and carried down sharply to the rectus fascia which was scored in the midline and extended laterally.  The fascia was taken off the muscles superiorly and inferiorly without difficulty.  The muscles were divided.  The peritoneal cavity was entered.  An medium Alexis self-retaining retractor was placed.  The upper abdomen was packed away. Both uterine cornu were grasped with Coker clamps.  The left round ligament was suture ligated and coagulated with the electrocautery unit.  The left vesicouterine serosal flap was created.  An avascular window in in the peritoneum was created and the utero-ovarian ligament was cross clamped, cut and suture ligated.  The right round ligament was suture ligated and cut with the electrocautery unit.  The vesicouterine serosal flap on the right was created.  An avascular window in the peritoneum was created and the right utero-ovarian ligament was cross clamped, cut  and double suture ligated.  Thus both ovaries were preserved.  The uterine vessels were skeletonized bilaterally.  The uterine vessels were clamped bilaterally,  then cut and suture ligated.  Two more pedicles were taken down the cervix medial to the uterine vessels.  Each pedicle was clamped cut and suture ligated with good resulting hemostasis.  As per the preoperative plan the cervix was then transected sharply and the specimen was removed.  The cervical stump was then closed anterior to posterior for hemostasis and reduce postoperative adhesions.  The pelvis was irrigated vigorously and all pedicles were examined and found to be hemostatic.    All specimens were sent to pathology for routine evaluation.  The Alexis self-retaining retractor was removed and the pelvis was irrigated vigorously.  All packs were removed and all counts were correct at this point x 3.  The muscles and peritoneum were reapproximated  loosely.  The fascia was closed with 0 Vicryl running.   The skin was closed using 3-0 Vicryl on a Keith needle in a subcuticular fashion.  Dermabond was then applied for additional wound integrity and to serve as a postoperative bacterial barrier.  The patient was awakened from anesthesia taken to the recovery room in good stable condition. All sponge instrument and needle counts were correct x 3.  The patient received 2 grams Ancef and Toradol prophylactically preoperatively.  Estimated blood loss for the procedure was 150  cc.  EURE,LUTHER H 02/07/2012 10:40 AM

## 2012-02-07 NOTE — Anesthesia Preprocedure Evaluation (Signed)
Anesthesia Evaluation  Patient identified by MRN, date of birth, ID band Patient awake    Reviewed: Allergy & Precautions, H&P , NPO status , Patient's Chart, lab work & pertinent test results  History of Anesthesia Complications (+) PONV  Airway Mallampati: III TM Distance: >3 FB     Dental  (+) Teeth Intact   Pulmonary neg pulmonary ROS,  breath sounds clear to auscultation        Cardiovascular negative cardio ROS  Rhythm:Regular Rate:Normal     Neuro/Psych    GI/Hepatic negative GI ROS,   Endo/Other    Renal/GU      Musculoskeletal   Abdominal   Peds  Hematology   Anesthesia Other Findings   Reproductive/Obstetrics                           Anesthesia Physical Anesthesia Plan  ASA: II  Anesthesia Plan: General   Post-op Pain Management:    Induction: Intravenous  Airway Management Planned: Oral ETT  Additional Equipment:   Intra-op Plan:   Post-operative Plan: Extubation in OR  Informed Consent: I have reviewed the patients History and Physical, chart, labs and discussed the procedure including the risks, benefits and alternatives for the proposed anesthesia with the patient or authorized representative who has indicated his/her understanding and acceptance.     Plan Discussed with:   Anesthesia Plan Comments: (Pt refuses blood products... Consent signed.)        Anesthesia Quick Evaluation

## 2012-02-08 LAB — BASIC METABOLIC PANEL
BUN: 7 mg/dL (ref 6–23)
CO2: 23 mEq/L (ref 19–32)
Chloride: 104 mEq/L (ref 96–112)
GFR calc Af Amer: >90 mL/min (ref >90)
Glucose, Bld: 127 mg/dL — ABNORMAL HIGH (ref 70–99)
Potassium: 4.1 mEq/L (ref 3.5–5.1)

## 2012-02-08 LAB — CBC
HCT: 35.3 % — ABNORMAL LOW (ref 36.0–46.0)
Hemoglobin: 11.7 g/dL — ABNORMAL LOW (ref 12.0–15.0)
RBC: 4.44 MIL/uL (ref 3.87–5.11)
RDW: 18.4 % — ABNORMAL HIGH (ref 11.5–15.5)
WBC: 8.5 10*3/uL (ref 4.0–10.5)

## 2012-02-08 MED ORDER — OXYCODONE-ACETAMINOPHEN 5-325 MG PO TABS: 1.0000 | ORAL_TABLET | ORAL | Status: DC | PRN

## 2012-02-08 MED ORDER — ONDANSETRON HCL 8 MG PO TABS: 8.0000 mg | ORAL_TABLET | Freq: Four times a day (QID) | ORAL | Status: DC | PRN

## 2012-02-08 NOTE — Discharge Summary (Signed)
Physician Discharge Summary  Patient ID: CHARONDA HEFTER MRN: [id number] DOB/AGE: 1970-11-02 42 y.o.  Admit date: 02/07/2012 Discharge date: 02/08/2012  Admission Diagnoses: fibroids Discharge Diagnoses:  S/p hysterectomy  Discharged Condition: good  Hospital Course: unremarkable  Consults: None  Significant Diagnostic Studies: labs:   Treatments: surgery: supracervical hysterectomy  Discharge Exam: Blood pressure 136/74, pulse 83, temperature 98.3 F (36.8 C), temperature source Oral, resp. rate 20, height 5\' 11"  (1.803 m), weight 99 kg (218 lb 4.1 oz), SpO2 100.00%. General appearance: alert, cooperative and no distress GI: soft, non-tender; bowel sounds normal; no masses,  no organomegaly Incision/Wound:clean dry  Disposition: Final discharge disposition not confirmed  Discharge Orders    Future Orders Please Complete By Expires   Diet - low sodium heart healthy      Increase activity slowly      Driving Restrictions      Comments:   none   Lifting restrictions      Comments:   10 pounds   Sexual Activity Restrictions      Comments:   none   No dressing needed      No wound care      Comments:   Keep clean dry       Medication List     As of 02/08/2012  9:28 AM    STOP taking these medications         ferrous sulfate 325 (65 FE) MG tablet      naproxen sodium 220 MG tablet   Commonly known as: ANAPROX      ONE-A-DAY WOMENS PO      TAKE these medications         ondansetron 8 MG tablet   Commonly known as: ZOFRAN   Take 1 tablet (8 mg total) by mouth every 6 (six) hours as needed for nausea.      oxyCODONE-acetaminophen 5-325 MG per tablet   Commonly known as: PERCOCET/ROXICET   Take 1-2 tablets by mouth every 4 (four) hours as needed (moderate to severe pain (when tolerating fluids)).           Follow-up Information    Follow up with Lazaro Arms, MD. In 1 week.   Contact information:   [address] [place]  [address] [phone]          Signed: Lazaro Arms 02/08/2012, 9:28 AM

## 2012-02-08 NOTE — Anesthesia Postprocedure Evaluation (Signed)
  Anesthesia Post-op Note  Patient: Julie Vega  Procedure(s) Performed: Procedure(s) (LRB) with comments: HYSTERECTOMY SUPRACERVICAL ABDOMINAL (N/A)  Patient Location: 330  Anesthesia Type:General  Level of Consciousness: awake, alert , oriented and patient cooperative  Airway and Oxygen Therapy: Patient Spontanous Breathing  Post-op Pain: 2 /10, mild  Post-op Assessment: Post-op Vital signs reviewed, Patient's Cardiovascular Status Stable, Respiratory Function Stable, Patent Airway, No signs of Nausea or vomiting, Adequate PO intake and Pain level controlled  Post-op Vital Signs: Reviewed and stable  Complications: No apparent anesthesia complications

## 2012-02-08 NOTE — Progress Notes (Signed)
IV removed, site WNL.  Pt given d/c instructions and new prescriptions.  Discussed home care with patient and discussed home medications, patient verbalizes understanding, teachback completed. F/U appointment in place, pt states they will keep appointment. Pt is stable at this time. Pt resting until her ride comes.

## 2012-02-08 NOTE — Addendum Note (Signed)
Addendum  created 02/08/12 1106 by Olanrewaju Osborn J Jarrett Albor, CRNA   Modules edited:Notes Section    

## 2012-02-09 ENCOUNTER — Encounter (HOSPITAL_COMMUNITY): Payer: Self-pay | Admitting: Obstetrics & Gynecology

## 2012-03-08 NOTE — Progress Notes (Signed)
UR Chart Review Completed  

## 2012-03-16 ENCOUNTER — Other Ambulatory Visit: Payer: Self-pay

## 2012-06-26 ENCOUNTER — Emergency Department (HOSPITAL_COMMUNITY)
Admission: EM | Admit: 2012-06-26 | Discharge: 2012-06-27 | Disposition: A | Discharge status: Home / Self Care | Payer: Medicaid Other | Attending: Emergency Medicine | Admitting: Emergency Medicine

## 2012-06-26 ENCOUNTER — Encounter (HOSPITAL_COMMUNITY): Payer: Self-pay | Admitting: *Deleted

## 2012-06-26 DIAGNOSIS — IMO0002 Reserved for concepts with insufficient information to code with codable children: Secondary | ICD-10-CM | POA: Insufficient documentation

## 2012-06-26 DIAGNOSIS — L0291 Cutaneous abscess, unspecified: Secondary | ICD-10-CM

## 2012-06-26 DIAGNOSIS — R011 Cardiac murmur, unspecified: Secondary | ICD-10-CM | POA: Insufficient documentation

## 2012-06-26 DIAGNOSIS — Z79899 Other long term (current) drug therapy: Secondary | ICD-10-CM | POA: Insufficient documentation

## 2012-06-26 MED ORDER — SODIUM BICARBONATE 4 % IV SOLN
5.0000 mL | Freq: Once | INTRAVENOUS | Status: AC
  Administered 2012-06-26: 5 mL via INTRAVENOUS
  Filled 2012-06-26: qty 5

## 2012-06-26 MED ORDER — LIDOCAINE HCL (PF) 2 % IJ SOLN
10.0000 mL | Freq: Once | INTRAMUSCULAR | Status: AC
  Administered 2012-06-26: 10 mL
  Filled 2012-06-26: qty 10

## 2012-06-26 MED ORDER — SULFAMETHOXAZOLE-TRIMETHOPRIM 800-160 MG PO TABS: 1.0000 | ORAL_TABLET | Freq: Two times a day (BID) | ORAL | Status: DC

## 2012-06-26 NOTE — ED Notes (Signed)
Pt has a "bump" on her left upper arm. Pt was seen and treated at [facility] last week for an "insect bite" and given Sulfamethoxazole-TMP. Pt states she is getting no relief from this medicine.

## 2012-06-28 NOTE — ED Provider Notes (Signed)
History     CSN: [id number]  Arrival date & time 06/26/12  2103   First MD Initiated Contact with Patient 06/26/12 2150      Chief Complaint  Patient presents with  . Recurrent Skin Infections    (Consider location/radiation/quality/duration/timing/severity/associated sxs/prior treatment) HPI Comments: Julie Vega is a 42 y.o. Female presenting with a persistent painful wound left upper anterior arm which she suspects is an infected insect bite.  She was seen at [facility],  Was placed on bactrim, currently day 5 of a 7 day course, after the area was "squeezed" until pus came out and has been applying warm soaks since, but with worsened pain and swelling.  She denies fevers and chills,  Has had no nausea.  There has been  No further drainage from the site.     The history is provided by the patient.    Past Medical History  Diagnosis Date  . Heart murmur   . PONV (postoperative nausea and vomiting)     Past Surgical History  Procedure Laterality Date  . Cholecystectomy, laparoscopic  2002  . Dilation and curettage of uterus  2002  . Breast excisional biopsy  1989  . Dilation and curettage of uterus  2002  . Cholecystectomy    . Supraclavical abdominal hysterectomy  02/07/2012    Procedure: HYSTERECTOMY SUPRACERVICAL ABDOMINAL;  Surgeon: Lazaro Arms, MD;  Location: [facility];  Service: Gynecology;  Laterality: N/A;  . Abdominal hysterectomy      Family History  Problem Relation Age of Onset  . Cancer - Colon Mother   . Diabetes type II Father   . Hypertension Father     History  Substance Use Topics  . Smoking status: Never Smoker   . Smokeless tobacco: Not on file  . Alcohol Use: No    OB History   Grav Para Term Preterm Abortions TAB SAB Ect Mult Living                  Review of Systems  Constitutional: Negative for fever and chills.  HENT: Negative for facial swelling.   Respiratory: Negative for shortness of breath and wheezing.    Gastrointestinal: Negative for nausea.  Skin: Positive for color change.  Neurological: Negative for numbness.    Allergies  Review of patient's allergies indicates no known allergies.  Home Medications   Current Outpatient Rx  Name  Route  Sig  Dispense  Refill  . ferrous sulfate 325 (65 FE) MG tablet   Oral   Take 325 mg by mouth daily with breakfast.         . HYDROcodone-acetaminophen (NORCO/VICODIN) 5-325 MG per tablet   Oral   Take 1 tablet by mouth every 6 (six) hours as needed for pain.         Marland Kitchen ibuprofen (ADVIL,MOTRIN) 800 MG tablet   Oral   Take 800 mg by mouth every 8 (eight) hours as needed for pain.         . Multiple Vitamin (MULTIVITAMIN WITH MINERALS) TABS   Oral   Take 1 tablet by mouth daily.         Marland Kitchen sulfamethoxazole-trimethoprim (BACTRIM DS,SEPTRA DS) 800-160 MG per tablet   Oral   Take 1 tablet by mouth 2 (two) times daily.         Marland Kitchen sulfamethoxazole-trimethoprim (SEPTRA DS) 800-160 MG per tablet   Oral   Take 1 tablet by mouth every 12 (twelve) hours.   6 tablet  0     BP 120/59  Pulse 74  Temp(Src) 97.7 F (36.5 C) (Oral)  Resp 20  Ht 5\' 11"  (1.803 m)  Wt 214 lb (97.07 kg)  BMI 29.86 kg/m2  SpO2 100%  LMP 11/09/2011  Physical Exam  Constitutional: She appears well-developed and well-nourished. No distress.  HENT:  Head: Normocephalic.  Neck: Neck supple.  Cardiovascular: Normal rate.   Pulmonary/Chest: Effort normal. She has no wheezes.  Musculoskeletal: Normal range of motion. She exhibits no edema.  Lymphadenopathy:    She has no axillary adenopathy.       Left: No supraclavicular and no epitrochlear adenopathy present.  Skin: Skin is warm and dry. There is erythema.  2 cm induration left anterior upper arm with a 3 cm surrounding erythema.  No drainage from the abscess,  Although there is pointing, but no fluctuance.  Central pustule.  No red streaking.    ED Course  Procedures (including critical care  time)   INCISION AND DRAINAGE Performed by: Burgess Amor Consent: Verbal consent obtained. Risks and benefits: risks, benefits and alternatives were discussed Type: abscess  Body area: left arm  Anesthesia: local infiltration  Incision was made with a scalpel.  Local anesthetic: lidocaine 1% without epinephrine  Anesthetic total: 4 ml  Complexity: complex Blunt dissection to break up loculations  Drainage: purulent  Drainage amount: moderate  Packing material: packing not applied  Patient tolerance: Patient tolerated the procedure well with no immediate complications.     Labs Reviewed - No data to display No results found.   1. Abscess       MDM  Pt encouraged warm soaks/shower with gentle massage of site.  She was given additional prescription of bactrim to complete 10 day course.  Advised recheck here or by pcp if not improving or if sx worsen with this tx.  The patient appears reasonably screened and/or stabilized for discharge and I doubt any other medical condition or other [facility] requiring further screening, evaluation, or treatment in the ED at this time prior to discharge.         Burgess Amor, PA-C 06/28/12 2343

## 2012-06-30 NOTE — ED Provider Notes (Signed)
Medical screening examination/treatment/procedure(s) were performed by non-physician practitioner and as supervising physician I was immediately available for consultation/collaboration.   Jedadiah Abdallah W. Gershom Brobeck, MD 06/30/12 1601 

## 2012-12-05 ENCOUNTER — Other Ambulatory Visit: Payer: Self-pay

## 2013-02-11 ENCOUNTER — Other Ambulatory Visit: Payer: Self-pay | Admitting: Obstetrics & Gynecology

## 2013-02-13 ENCOUNTER — Ambulatory Visit (INDEPENDENT_AMBULATORY_CARE_PROVIDER_SITE_OTHER): Payer: Medicaid Other | Admitting: Obstetrics & Gynecology

## 2013-02-13 ENCOUNTER — Other Ambulatory Visit (HOSPITAL_COMMUNITY)
Admission: RE | Admit: 2013-02-13 | Discharge: 2013-02-13 | Disposition: A | Discharge status: Home / Self Care | Payer: Medicaid Other | Source: Ambulatory Visit | Attending: Obstetrics & Gynecology | Admitting: Obstetrics & Gynecology

## 2013-02-13 ENCOUNTER — Encounter: Payer: Self-pay | Admitting: Obstetrics & Gynecology

## 2013-02-13 VITALS — BP 100/60 | Ht 71.0 in | Wt 208.0 lb

## 2013-02-13 DIAGNOSIS — Z01419 Encounter for gynecological examination (general) (routine) without abnormal findings: Secondary | ICD-10-CM | POA: Insufficient documentation

## 2013-02-13 DIAGNOSIS — Z0000 Encounter for general adult medical examination without abnormal findings: Secondary | ICD-10-CM

## 2013-02-13 DIAGNOSIS — Z1151 Encounter for screening for human papillomavirus (HPV): Secondary | ICD-10-CM | POA: Insufficient documentation

## 2013-02-13 NOTE — Addendum Note (Signed)
Addended by: Linton Rump on: 02/13/2013 12:04 PM   Modules accepted: Orders

## 2013-02-13 NOTE — Progress Notes (Signed)
Patient ID: Julie Vega, female   DOB: 1970/08/15, 43 y.o.   MRN: [id number] Subjective:     Julie Vega is a 43 y.o. female here for a routine exam.  Patient's last menstrual period was 11/09/2011. No obstetric history on file. Birth Control Method:  Supracervical hysterectomy  Menstrual Calendar(currently): na   Current complaints: none.   Current acute medical issues:  none   Recent Gynecologic History Patient's last menstrual period was 11/09/2011. Last Pap: 2012,  normal Last mammogram: unsure,  normal  Past Medical History  Diagnosis Date  . Heart murmur   . PONV (postoperative nausea and vomiting)     Past Surgical History  Procedure Laterality Date  . Cholecystectomy, laparoscopic  2002  . Dilation and curettage of uterus  2002  . Breast excisional biopsy  1989  . Dilation and curettage of uterus  2002  . Cholecystectomy    . Supracervical abdominal hysterectomy  02/07/2012    Procedure: HYSTERECTOMY SUPRACERVICAL ABDOMINAL;  Surgeon: Florian Buff, MD;  Location: [facility];  Service: Gynecology;  Laterality: N/A;  . Abdominal hysterectomy      OB History   Grav Para Term Preterm Abortions TAB SAB Ect Mult Living                  History   Social History  . Marital Status: Single    Spouse Name: N/A    Number of Children: N/A  . Years of Education: N/A   Social History Main Topics  . Smoking status: Never Smoker   . Smokeless tobacco: None  . Alcohol Use: No  . Drug Use: No  . Sexual Activity: No   Other Topics Concern  . None   Social History Narrative  . None    Family History  Problem Relation Age of Onset  . Cancer - Colon Mother   . Diabetes type II Father   . Hypertension Father      Review of Systems  Review of Systems  Constitutional: Negative for fever, chills, weight loss, malaise/fatigue and diaphoresis.  HENT: Negative for hearing loss, ear pain, nosebleeds, congestion, sore throat, neck pain, tinnitus and ear  discharge.   Eyes: Negative for blurred vision, double vision, photophobia, pain, discharge and redness.  Respiratory: Negative for cough, hemoptysis, sputum production, shortness of breath, wheezing and stridor.   Cardiovascular: Negative for chest pain, palpitations, orthopnea, claudication, leg swelling and PND.  Gastrointestinal: negative for abdominal pain. Negative for heartburn, nausea, vomiting, diarrhea, constipation, blood in stool and melena.  Genitourinary: Negative for dysuria, urgency, frequency, hematuria and flank pain.  Musculoskeletal: Negative for myalgias, back pain, joint pain and falls.  Skin: Negative for itching and rash.  Neurological: Negative for dizziness, tingling, tremors, sensory change, speech change, focal weakness, seizures, loss of consciousness, weakness and headaches.  Endo/Heme/Allergies: Negative for environmental allergies and polydipsia. Does not bruise/bleed easily.  Psychiatric/Behavioral: Negative for depression, suicidal ideas, hallucinations, memory loss and substance abuse. The patient is not nervous/anxious and does not have insomnia.        Objective:    Physical Exam  Vitals reviewed. Constitutional: She is oriented to person, place, and time. She appears well-developed and well-nourished.  HENT:  Head: Normocephalic and atraumatic.        Right Ear: External ear normal.  Left Ear: External ear normal.  Nose: Nose normal.  Mouth/Throat: Oropharynx is clear and moist.  Eyes: Conjunctivae and EOM are normal. Pupils are equal, round, and reactive to light.  Right eye exhibits no discharge. Left eye exhibits no discharge. No scleral icterus.  Neck: Normal range of motion. Neck supple. No tracheal deviation present. No thyromegaly present.  Cardiovascular: Normal rate, regular rhythm, normal heart sounds and intact distal pulses.  Exam reveals no gallop and no friction rub.   No murmur heard. Respiratory: Effort normal and breath sounds normal.  No respiratory distress. She has no wheezes. She has no rales. She exhibits no tenderness.  GI: Soft. Bowel sounds are normal. She exhibits no distension and no mass. There is no tenderness. There is no rebound and no guarding.  Genitourinary:  Breasts no masses skin changes or nipple changes bilaterally      Vulva is normal without lesions Vagina is pink moist without discharge Cervix normal in appearance and pap is done Uterus is absent Adnexa is negative with normal sized ovaries    Musculoskeletal: Normal range of motion. She exhibits no edema and no tenderness.  Neurological: She is alert and oriented to person, place, and time. She has normal reflexes. She displays normal reflexes. No cranial nerve deficit. She exhibits normal muscle tone. Coordination normal.  Skin: Skin is warm and dry. No rash noted. No erythema. No pallor.  Psychiatric: She has a normal mood and affect. Her behavior is normal. Judgment and thought content normal.       Assessment:    Healthy female exam.    Plan:    Mammogram ordered. Follow up in: 1 year.

## 2013-02-14 ENCOUNTER — Other Ambulatory Visit: Payer: Self-pay | Admitting: Obstetrics & Gynecology

## 2013-02-14 DIAGNOSIS — Z139 Encounter for screening, unspecified: Secondary | ICD-10-CM

## 2013-02-21 ENCOUNTER — Ambulatory Visit (HOSPITAL_COMMUNITY)
Admission: RE | Admit: 2013-02-21 | Discharge: 2013-02-21 | Disposition: A | Discharge status: Home / Self Care | Payer: Medicaid Other | Source: Ambulatory Visit | Attending: Obstetrics & Gynecology | Admitting: Obstetrics & Gynecology

## 2013-02-21 DIAGNOSIS — Z139 Encounter for screening, unspecified: Secondary | ICD-10-CM

## 2013-02-21 DIAGNOSIS — Z1231 Encounter for screening mammogram for malignant neoplasm of breast: Secondary | ICD-10-CM | POA: Insufficient documentation

## 2013-03-10 ENCOUNTER — Other Ambulatory Visit: Payer: Self-pay | Admitting: Obstetrics & Gynecology

## 2013-03-10 DIAGNOSIS — R928 Other abnormal and inconclusive findings on diagnostic imaging of breast: Secondary | ICD-10-CM

## 2013-03-19 ENCOUNTER — Encounter (HOSPITAL_COMMUNITY): Payer: Medicaid Other

## 2013-03-26 ENCOUNTER — Ambulatory Visit (HOSPITAL_COMMUNITY)
Admission: RE | Admit: 2013-03-26 | Discharge: 2013-03-26 | Disposition: A | Discharge status: Home / Self Care | Payer: Medicaid Other | Source: Ambulatory Visit | Attending: Obstetrics & Gynecology | Admitting: Obstetrics & Gynecology

## 2013-03-26 DIAGNOSIS — R928 Other abnormal and inconclusive findings on diagnostic imaging of breast: Secondary | ICD-10-CM

## 2013-10-08 ENCOUNTER — Other Ambulatory Visit: Payer: Self-pay | Admitting: Obstetrics & Gynecology

## 2013-10-08 DIAGNOSIS — R928 Other abnormal and inconclusive findings on diagnostic imaging of breast: Secondary | ICD-10-CM

## 2013-10-21 ENCOUNTER — Encounter (HOSPITAL_COMMUNITY): Payer: Medicaid Other

## 2013-10-28 ENCOUNTER — Ambulatory Visit (HOSPITAL_COMMUNITY)
Admission: RE | Admit: 2013-10-28 | Discharge: 2013-10-28 | Disposition: A | Discharge status: Home / Self Care | Payer: Medicaid Other | Source: Ambulatory Visit | Attending: Obstetrics & Gynecology | Admitting: Obstetrics & Gynecology

## 2013-10-28 DIAGNOSIS — R928 Other abnormal and inconclusive findings on diagnostic imaging of breast: Secondary | ICD-10-CM | POA: Insufficient documentation

## 2014-01-28 ENCOUNTER — Emergency Department (HOSPITAL_COMMUNITY)
Admission: EM | Admit: 2014-01-28 | Discharge: 2014-01-29 | Disposition: A | Discharge status: Home / Self Care | Payer: Medicaid Other | Attending: Emergency Medicine | Admitting: Emergency Medicine

## 2014-01-28 ENCOUNTER — Encounter (HOSPITAL_COMMUNITY): Payer: Self-pay | Admitting: Emergency Medicine

## 2014-01-28 ENCOUNTER — Emergency Department (HOSPITAL_COMMUNITY): Payer: Medicaid Other

## 2014-01-28 DIAGNOSIS — Y92019 Unspecified place in single-family (private) house as the place of occurrence of the external cause: Secondary | ICD-10-CM | POA: Insufficient documentation

## 2014-01-28 DIAGNOSIS — Z79899 Other long term (current) drug therapy: Secondary | ICD-10-CM | POA: Insufficient documentation

## 2014-01-28 DIAGNOSIS — Z23 Encounter for immunization: Secondary | ICD-10-CM | POA: Insufficient documentation

## 2014-01-28 DIAGNOSIS — M79672 Pain in left foot: Secondary | ICD-10-CM

## 2014-01-28 DIAGNOSIS — Z792 Long term (current) use of antibiotics: Secondary | ICD-10-CM | POA: Insufficient documentation

## 2014-01-28 DIAGNOSIS — Y9389 Activity, other specified: Secondary | ICD-10-CM | POA: Insufficient documentation

## 2014-01-28 DIAGNOSIS — Y998 Other external cause status: Secondary | ICD-10-CM | POA: Insufficient documentation

## 2014-01-28 DIAGNOSIS — R011 Cardiac murmur, unspecified: Secondary | ICD-10-CM | POA: Insufficient documentation

## 2014-01-28 DIAGNOSIS — S90932A Unspecified superficial injury of left great toe, initial encounter: Secondary | ICD-10-CM | POA: Insufficient documentation

## 2014-01-28 DIAGNOSIS — W273XXA Contact with needle (sewing), initial encounter: Secondary | ICD-10-CM | POA: Insufficient documentation

## 2014-01-28 MED ORDER — TETANUS-DIPHTH-ACELL PERTUSSIS 5-2.5-18.5 LF-MCG/0.5 IM SUSP
0.5000 mL | Freq: Once | INTRAMUSCULAR | Status: AC
  Administered 2014-01-28: 0.5 mL via INTRAMUSCULAR
  Filled 2014-01-28: qty 0.5

## 2014-01-28 MED ORDER — OXYCODONE-ACETAMINOPHEN 5-325 MG PO TABS: 2.0000 | ORAL_TABLET | ORAL | Status: DC | PRN

## 2014-01-28 MED ORDER — OXYCODONE-ACETAMINOPHEN 5-325 MG PO TABS
2.0000 | ORAL_TABLET | Freq: Once | ORAL | Status: DC
  Filled 2014-01-28: qty 2

## 2014-01-28 MED ORDER — CEPHALEXIN 500 MG PO CAPS: 500.0000 mg | ORAL_CAPSULE | Freq: Four times a day (QID) | ORAL | Status: DC

## 2014-01-28 NOTE — ED Provider Notes (Signed)
CSN: [id number]     Arrival date & time 01/28/14  2047 History   First MD Initiated Contact with Patient 01/28/14 2202     Chief Complaint  Patient presents with  . Foot Injury     (Consider location/radiation/quality/duration/timing/severity/associated sxs/prior Treatment) HPI Julie Vega is a 43 y.o. female who reports 3 days ago she was at home when she stepped on a needle that entered her left great toe and broke off inside her toe. She was seen at an urgent care facility [facility] and was referred to orthopedics for foreign body removal. She spoke with her orthopedic surgeon today and they have surgery scheduled for Tuesday. She presents to the ED today for increased pain in her toe. She denies any numbness or weakness, redness or swelling, bleeding or drainage. Palpation of the site worsens the pain. There are no other modifying factors.  Past Medical History  Diagnosis Date  . Heart murmur   . PONV (postoperative nausea and vomiting)    Past Surgical History  Procedure Laterality Date  . Cholecystectomy, laparoscopic  2002  . Dilation and curettage of uterus  2002  . Breast excisional biopsy  1989  . Dilation and curettage of uterus  2002  . Cholecystectomy    . Supracervical abdominal hysterectomy  02/07/2012    Procedure: HYSTERECTOMY SUPRACERVICAL ABDOMINAL;  Surgeon: Florian Buff, MD;  Location: [facility];  Service: Gynecology;  Laterality: N/A;  . Abdominal hysterectomy     Family History  Problem Relation Age of Onset  . Cancer - Colon Mother   . Diabetes type II Father   . Hypertension Father    History  Substance Use Topics  . Smoking status: Never Smoker   . Smokeless tobacco: Not on file  . Alcohol Use: No   OB History    No data available     Review of Systems A 10 point review of systems was completed and was negative except for pertinent positives and negatives as mentioned in the history of present illness     Allergies  Review of  patient's allergies indicates no known allergies.  Home Medications   Prior to Admission medications   Medication Sig Start Date End Date Taking? Authorizing Provider  BIOTIN PO Take by mouth.   Yes Historical Provider, MD  ibuprofen (ADVIL,MOTRIN) 800 MG tablet Take 800 mg by mouth every 8 (eight) hours as needed for pain.   Yes Historical Provider, MD  Multiple Vitamin (MULTIVITAMIN WITH MINERALS) TABS Take 1 tablet by mouth daily.   Yes Historical Provider, MD  cephALEXin (KEFLEX) 500 MG capsule Take 1 capsule (500 mg total) by mouth 4 (four) times daily. 01/28/14   Verl Dicker, PA-C  ferrous sulfate 325 (65 FE) MG tablet Take 325 mg by mouth daily with breakfast.    Historical Provider, MD  HYDROcodone-acetaminophen (NORCO/VICODIN) 5-325 MG per tablet Take 1 tablet by mouth every 6 (six) hours as needed for pain.    Historical Provider, MD  oxyCODONE-acetaminophen (PERCOCET) 5-325 MG per tablet Take 2 tablets by mouth every 4 (four) hours as needed. 01/28/14   Viona Gilmore Shalini Mair, PA-C  sulfamethoxazole-trimethoprim (SEPTRA DS) 800-160 MG per tablet Take 1 tablet by mouth every 12 (twelve) hours. Patient not taking: Reported on 01/28/2014 06/26/12   Evalee Jefferson, PA-C   BP 118/79 mmHg  Pulse 72  Temp(Src) 97.6 F (36.4 C) (Oral)  Resp 16  Ht 5\' 11"  (1.803 m)  Wt 193 lb (87.544 kg)  BMI  26.93 kg/m2  SpO2 100%  LMP 11/09/2011 Physical Exam  Constitutional: She is oriented to person, place, and time. She appears well-developed and well-nourished.  HENT:  Head: Normocephalic and atraumatic.  Mouth/Throat: Oropharynx is clear and moist.  Eyes: Conjunctivae are normal. Pupils are equal, round, and reactive to light. Right eye exhibits no discharge. Left eye exhibits no discharge. No scleral icterus.  Neck: Neck supple.  Cardiovascular: Normal rate, regular rhythm and normal heart sounds.   Pulmonary/Chest: Effort normal and breath sounds normal. No respiratory distress. She has no  wheezes. She has no rales.  Abdominal: Soft. There is no tenderness.  Musculoskeletal: She exhibits no tenderness.  Neurological: She is alert and oriented to person, place, and time.  Cranial Nerves II-XII grossly intact  Skin: Skin is warm and dry. No rash noted.  No obvious entrance wound. No erythema, swelling or other lesions appreciated. Distal pulses intact. Cap refill less than 2 seconds. Motor and sensation 5/5.  Psychiatric: She has a normal mood and affect.  Nursing note and vitals reviewed.   ED Course  Procedures (including critical care time) Labs Review Labs Reviewed - No data to display  Imaging Review Dg Foot Complete Left  01/28/2014   CLINICAL DATA:  Stepped on a needle, went through great toe.  EXAM: LEFT FOOT - COMPLETE 3+ VIEW  COMPARISON:  None.  FINDINGS: There is no evidence of fracture or dislocation. There is no evidence of arthropathy or other focal bone abnormality. Needle fragment within the lateral first digit distal phalanx soft tissues, no subcutaneous gas.  IMPRESSION: Needle fragment within the lateral first digit distal phalanx soft tissues.  No acute osseous process.   Electronically Signed   By: Elon Alas   On: 01/28/2014 23:17     EKG Interpretation None     Meds given in ED:  Medications  oxyCODONE-acetaminophen (PERCOCET/ROXICET) 5-325 MG per tablet 2 tablet (not administered)  Tdap (BOOSTRIX) injection 0.5 mL (0.5 mLs Intramuscular Given 01/28/14 2359)    Discharge Medication List as of 01/28/2014 11:51 PM    START taking these medications   Details  cephALEXin (KEFLEX) 500 MG capsule Take 1 capsule (500 mg total) by mouth 4 (four) times daily., Starting 01/28/2014, Until Discontinued, Print    oxyCODONE-acetaminophen (PERCOCET) 5-325 MG per tablet Take 2 tablets by mouth every 4 (four) hours as needed., Starting 01/28/2014, Until Discontinued, Print       Filed Vitals:   01/28/14 2054 01/29/14 0006  BP: 116/70 118/79   Pulse: 81 72  Temp: 97.6 F (36.4 C)   TempSrc: Oral   Resp: 16   Height: 5\' 11"  (1.803 m)   Weight: 193 lb (87.544 kg)   SpO2: 100% 100%    MDM  Julie Vega is a 43 y.o. female who presents today after stepping on a needle at home 3 days ago. Needle reported to have entered left great toe lateral side. She has an appointment on Tuesday with her orthopedic surgeon to remove the needle from her toe. She denies any fevers, chills, redness or swelling, numbness or weakness.  On exam there is no entrance or exit wound. No redness or swelling. No sign of infection. Neurovascularly intact. Distal pulses intact  X-ray of left great toe shows needle foreign body in soft tissues.  Will DC with antibiotics, pain medicine. Patient already has postop shoe for comfort. Patient will need to follow-up with her orthopedic surgeon on Tuesday for definitive care and/or surgery. Prior to patient  discharge, I discussed and reviewed this case with Dr.Kohut  Final diagnoses:  Foot pain, left        Grants, PA-C 01/29/14 0010  Virgel Manifold, MD 01/29/14 [phone]

## 2014-01-28 NOTE — ED Notes (Signed)
Pt from home, stepped on a needle Sunday with her LT foot. Pt states needle is broken in foot and has an appointment on Tuesday with ortho to get it removed. Pt states pain has increased. Denies increase in redness/swelling.

## 2014-01-28 NOTE — ED Notes (Signed)
Pt is driving home tonight. Percocet not given. Will administer later if pt is able to get ride home

## 2014-01-28 NOTE — Discharge Instructions (Signed)
It is important for you to follow up with your orthopedic surgeon for your regularly scheduled appointment on Tuesday to remove the needle from toe. Please take all of her antibiotic as directed. He may take your pain medicine as needed for severe pain. Return to ED for worsening symptoms, fevers, chills, increased redness or swelling, numbness or weakness

## 2014-07-21 ENCOUNTER — Other Ambulatory Visit: Payer: Self-pay | Admitting: Obstetrics & Gynecology

## 2014-07-21 DIAGNOSIS — R921 Mammographic calcification found on diagnostic imaging of breast: Secondary | ICD-10-CM

## 2014-08-04 ENCOUNTER — Ambulatory Visit (HOSPITAL_COMMUNITY)
Admission: RE | Admit: 2014-08-04 | Discharge: 2014-08-04 | Disposition: A | Discharge status: Home / Self Care | Payer: 59 | Source: Ambulatory Visit | Attending: Obstetrics & Gynecology | Admitting: Obstetrics & Gynecology

## 2014-08-04 DIAGNOSIS — R921 Mammographic calcification found on diagnostic imaging of breast: Secondary | ICD-10-CM

## 2014-08-11 ENCOUNTER — Other Ambulatory Visit: Payer: Self-pay | Admitting: Obstetrics & Gynecology

## 2014-08-11 ENCOUNTER — Ambulatory Visit (HOSPITAL_COMMUNITY)
Admission: RE | Admit: 2014-08-11 | Discharge: 2014-08-11 | Disposition: A | Discharge status: Home / Self Care | Payer: 59 | Source: Ambulatory Visit | Attending: Obstetrics & Gynecology | Admitting: Obstetrics & Gynecology

## 2014-08-11 DIAGNOSIS — R928 Other abnormal and inconclusive findings on diagnostic imaging of breast: Secondary | ICD-10-CM | POA: Diagnosis present

## 2014-08-11 DIAGNOSIS — R921 Mammographic calcification found on diagnostic imaging of breast: Secondary | ICD-10-CM

## 2014-08-13 ENCOUNTER — Inpatient Hospital Stay: Admission: RE | Admit: 2014-08-13 | Payer: 59 | Source: Ambulatory Visit

## 2014-08-17 ENCOUNTER — Ambulatory Visit
Admission: RE | Admit: 2014-08-17 | Discharge: 2014-08-17 | Disposition: A | Discharge status: Home / Self Care | Payer: 59 | Source: Ambulatory Visit | Attending: Obstetrics & Gynecology | Admitting: Obstetrics & Gynecology

## 2014-08-17 ENCOUNTER — Ambulatory Visit: Payer: 59

## 2014-08-17 DIAGNOSIS — R921 Mammographic calcification found on diagnostic imaging of breast: Secondary | ICD-10-CM

## 2015-10-18 ENCOUNTER — Telehealth: Payer: Self-pay | Admitting: Obstetrics & Gynecology

## 2015-10-18 ENCOUNTER — Other Ambulatory Visit: Payer: Self-pay | Admitting: *Deleted

## 2015-10-18 DIAGNOSIS — Z87898 Personal history of other specified conditions: Secondary | ICD-10-CM

## 2015-10-18 NOTE — Telephone Encounter (Signed)
noted 

## 2015-10-18 NOTE — Telephone Encounter (Signed)
Pt requesting an order for diagnostic mammogram. Orders placed in EPIC per pt request.

## 2015-11-15 ENCOUNTER — Other Ambulatory Visit: Payer: Self-pay

## 2015-11-29 ENCOUNTER — Other Ambulatory Visit (HOSPITAL_COMMUNITY)
Admission: RE | Admit: 2015-11-29 | Discharge: 2015-11-29 | Disposition: A | Discharge status: Home / Self Care | Payer: BLUE CROSS/BLUE SHIELD | Source: Ambulatory Visit | Attending: Obstetrics & Gynecology | Admitting: Obstetrics & Gynecology

## 2015-11-29 ENCOUNTER — Ambulatory Visit (INDEPENDENT_AMBULATORY_CARE_PROVIDER_SITE_OTHER): Payer: BLUE CROSS/BLUE SHIELD | Admitting: Obstetrics & Gynecology

## 2015-11-29 ENCOUNTER — Encounter: Payer: Self-pay | Admitting: Obstetrics & Gynecology

## 2015-11-29 VITALS — BP 100/80 | HR 80 | Ht 70.0 in | Wt 223.0 lb

## 2015-11-29 DIAGNOSIS — Z01419 Encounter for gynecological examination (general) (routine) without abnormal findings: Secondary | ICD-10-CM | POA: Insufficient documentation

## 2015-11-29 NOTE — Progress Notes (Signed)
Subjective:     Julie Vega is a 45 y.o. female here for a routine exam.  Patient's last menstrual period was 11/09/2011. No obstetric history on file. Birth Control Method:  hysterectomy Menstrual Calendar(currently): none  Current complaints: none.   Current acute medical issues:  none   Recent Gynecologic History Patient's last menstrual period was 11/09/2011. Last Pap: 2014,  normal Last mammogram: 6 months ago,  Breast density issues, short interval repeat  Past Medical History:  Diagnosis Date  . Heart murmur   . PONV (postoperative nausea and vomiting)     Past Surgical History:  Procedure Laterality Date  . ABDOMINAL HYSTERECTOMY    . BREAST EXCISIONAL BIOPSY  1989  . CHOLECYSTECTOMY    . CHOLECYSTECTOMY, LAPAROSCOPIC  2002  . DILATION AND CURETTAGE OF UTERUS  2002  . DILATION AND CURETTAGE OF UTERUS  2002  . SUPRACERVICAL ABDOMINAL HYSTERECTOMY  02/07/2012   Procedure: HYSTERECTOMY SUPRACERVICAL ABDOMINAL;  Surgeon: Florian Buff, MD;  Location: [facility];  Service: Gynecology;  Laterality: N/A;    OB History    No data available      Social History   Social History  . Marital status: Single    Spouse name: N/A  . Number of children: N/A  . Years of education: N/A   Social History Main Topics  . Smoking status: Never Smoker  . Smokeless tobacco: Never Used  . Alcohol use No  . Drug use: No  . Sexual activity: Yes   Other Topics Concern  . None   Social History Narrative  . None    Family History  Problem Relation Age of Onset  . Cancer - Colon Mother   . Diabetes type II Father   . Hypertension Father      Current Outpatient Prescriptions:  .  BIOTIN PO, Take by mouth., Disp: , Rfl:   Review of Systems  Review of Systems  Constitutional: Negative for fever, chills, weight loss, malaise/fatigue and diaphoresis.  HENT: Negative for hearing loss, ear pain, nosebleeds, congestion, sore throat, neck pain, tinnitus and ear discharge.    Eyes: Negative for blurred vision, double vision, photophobia, pain, discharge and redness.  Respiratory: Negative for cough, hemoptysis, sputum production, shortness of breath, wheezing and stridor.   Cardiovascular: Negative for chest pain, palpitations, orthopnea, claudication, leg swelling and PND.  Gastrointestinal: negative for abdominal pain. Negative for heartburn, nausea, vomiting, diarrhea, constipation, blood in stool and melena.  Genitourinary: Negative for dysuria, urgency, frequency, hematuria and flank pain.  Musculoskeletal: Negative for myalgias, back pain, joint pain and falls.  Skin: Negative for itching and rash.  Neurological: Negative for dizziness, tingling, tremors, sensory change, speech change, focal weakness, seizures, loss of consciousness, weakness and headaches.  Endo/Heme/Allergies: Negative for environmental allergies and polydipsia. Does not bruise/bleed easily.  Psychiatric/Behavioral: Negative for depression, suicidal ideas, hallucinations, memory loss and substance abuse. The patient is not nervous/anxious and does not have insomnia.        Objective:  Blood pressure 100/80, pulse 80, height 5\' 10"  (1.778 m), weight 223 lb (101.2 kg), last menstrual period 11/09/2011.   Physical Exam  Vitals reviewed. Constitutional: Julie Vega is oriented to person, place, and time. Julie Vega appears well-developed and well-nourished.  HENT:  Head: Normocephalic and atraumatic.        Right Ear: External ear normal.  Left Ear: External ear normal.  Nose: Nose normal.  Mouth/Throat: Oropharynx is clear and moist.  Eyes: Conjunctivae and EOM are normal. Pupils are equal, round,  and reactive to light. Right eye exhibits no discharge. Left eye exhibits no discharge. No scleral icterus.  Neck: Normal range of motion. Neck supple. No tracheal deviation present. No thyromegaly present.  Cardiovascular: Normal rate, regular rhythm, normal heart sounds and intact distal pulses.  Exam  reveals no gallop and no friction rub.   No murmur heard. Respiratory: Effort normal and breath sounds normal. No respiratory distress. Julie Vega has no wheezes. Julie Vega has no rales. Julie Vega exhibits no tenderness.  GI: Soft. Bowel sounds are normal. Julie Vega exhibits no distension and no mass. There is no tenderness. There is no rebound and no guarding.  Genitourinary:  Breasts no masses skin changes or nipple changes bilaterally      Vulva is normal without lesions Vagina is pink moist without discharge Cervix normal in appearance and pap is done Uterus is absent Adnexa is negative with normal sized ovaries   Musculoskeletal: Normal range of motion. Julie Vega exhibits no edema and no tenderness.  Neurological: Julie Vega is alert and oriented to person, place, and time. Julie Vega has normal reflexes. Julie Vega displays normal reflexes. No cranial nerve deficit. Julie Vega exhibits normal muscle tone. Coordination normal.  Skin: Skin is warm and dry. No rash noted. No erythema. No pallor.  Psychiatric: Julie Vega has a normal mood and affect. Julie Vega behavior is normal. Judgment and thought content normal.       Medications Ordered at today's visit: No orders of the defined types were placed in this encounter.   Other orders placed at today's visit: No orders of the defined types were placed in this encounter.     Assessment:    Healthy female exam.    Plan:    Mammogram ordered. Follow up in: 2 years.     No Follow-up on file.

## 2015-11-29 NOTE — Addendum Note (Signed)
Addended by: Diona Fanti A on: 11/29/2015 04:29 PM   Modules accepted: Orders

## 2015-12-01 LAB — CYTOLOGY - PAP: DIAGNOSIS: NEGATIVE

## 2016-05-19 DIAGNOSIS — J189 Pneumonia, unspecified organism: Secondary | ICD-10-CM | POA: Diagnosis not present

## 2016-05-19 DIAGNOSIS — J111 Influenza due to unidentified influenza virus with other respiratory manifestations: Secondary | ICD-10-CM | POA: Diagnosis not present

## 2016-05-23 DIAGNOSIS — J208 Acute bronchitis due to other specified organisms: Secondary | ICD-10-CM | POA: Diagnosis not present

## 2016-05-23 DIAGNOSIS — Z6832 Body mass index (BMI) 32.0-32.9, adult: Secondary | ICD-10-CM | POA: Diagnosis not present

## 2016-07-06 ENCOUNTER — Other Ambulatory Visit: Payer: Self-pay | Admitting: Obstetrics & Gynecology

## 2016-07-06 DIAGNOSIS — Z87898 Personal history of other specified conditions: Secondary | ICD-10-CM

## 2016-07-06 DIAGNOSIS — R921 Mammographic calcification found on diagnostic imaging of breast: Secondary | ICD-10-CM

## 2016-07-11 ENCOUNTER — Ambulatory Visit
Admission: RE | Admit: 2016-07-11 | Discharge: 2016-07-11 | Disposition: A | Discharge status: Home / Self Care | Payer: BLUE CROSS/BLUE SHIELD | Source: Ambulatory Visit | Attending: Obstetrics & Gynecology | Admitting: Obstetrics & Gynecology

## 2016-07-11 DIAGNOSIS — Z87898 Personal history of other specified conditions: Secondary | ICD-10-CM

## 2016-07-11 DIAGNOSIS — R922 Inconclusive mammogram: Secondary | ICD-10-CM | POA: Diagnosis not present

## 2016-07-11 DIAGNOSIS — R921 Mammographic calcification found on diagnostic imaging of breast: Secondary | ICD-10-CM

## 2017-06-20 ENCOUNTER — Other Ambulatory Visit: Payer: Self-pay | Admitting: Obstetrics & Gynecology

## 2017-06-20 DIAGNOSIS — Z1231 Encounter for screening mammogram for malignant neoplasm of breast: Secondary | ICD-10-CM

## 2017-07-12 ENCOUNTER — Ambulatory Visit: Payer: BLUE CROSS/BLUE SHIELD

## 2017-07-25 DIAGNOSIS — Z0000 Encounter for general adult medical examination without abnormal findings: Secondary | ICD-10-CM | POA: Diagnosis not present

## 2017-07-25 DIAGNOSIS — J208 Acute bronchitis due to other specified organisms: Secondary | ICD-10-CM | POA: Diagnosis not present

## 2017-07-25 DIAGNOSIS — Z6832 Body mass index (BMI) 32.0-32.9, adult: Secondary | ICD-10-CM | POA: Diagnosis not present

## 2017-07-25 DIAGNOSIS — Z6830 Body mass index (BMI) 30.0-30.9, adult: Secondary | ICD-10-CM | POA: Diagnosis not present

## 2017-09-03 ENCOUNTER — Ambulatory Visit
Admission: RE | Admit: 2017-09-03 | Discharge: 2017-09-03 | Disposition: A | Discharge status: Home / Self Care | Payer: BLUE CROSS/BLUE SHIELD | Source: Ambulatory Visit | Attending: Obstetrics & Gynecology | Admitting: Obstetrics & Gynecology

## 2017-09-03 ENCOUNTER — Ambulatory Visit: Payer: BLUE CROSS/BLUE SHIELD

## 2017-09-03 DIAGNOSIS — Z1231 Encounter for screening mammogram for malignant neoplasm of breast: Secondary | ICD-10-CM

## 2017-12-17 ENCOUNTER — Other Ambulatory Visit: Payer: BLUE CROSS/BLUE SHIELD | Admitting: Obstetrics & Gynecology

## 2017-12-17 ENCOUNTER — Encounter: Payer: Self-pay | Admitting: *Deleted

## 2018-02-18 ENCOUNTER — Other Ambulatory Visit (HOSPITAL_COMMUNITY)
Admission: RE | Admit: 2018-02-18 | Discharge: 2018-02-18 | Disposition: A | Discharge status: Home / Self Care | Payer: BLUE CROSS/BLUE SHIELD | Source: Ambulatory Visit | Attending: Obstetrics & Gynecology | Admitting: Obstetrics & Gynecology

## 2018-02-18 ENCOUNTER — Ambulatory Visit (INDEPENDENT_AMBULATORY_CARE_PROVIDER_SITE_OTHER): Payer: BLUE CROSS/BLUE SHIELD | Admitting: Obstetrics & Gynecology

## 2018-02-18 ENCOUNTER — Encounter: Payer: Self-pay | Admitting: Obstetrics & Gynecology

## 2018-02-18 VITALS — BP 122/72 | HR 75 | Ht 70.0 in | Wt 201.0 lb

## 2018-02-18 DIAGNOSIS — Z01419 Encounter for gynecological examination (general) (routine) without abnormal findings: Secondary | ICD-10-CM

## 2018-02-18 DIAGNOSIS — N760 Acute vaginitis: Secondary | ICD-10-CM

## 2018-02-18 DIAGNOSIS — B9689 Other specified bacterial agents as the cause of diseases classified elsewhere: Secondary | ICD-10-CM

## 2018-02-18 MED ORDER — METRONIDAZOLE 0.75 % VA GEL: VAGINAL | 0 refills | Status: DC

## 2018-02-18 NOTE — Progress Notes (Signed)
Subjective:     Julie Vega is a 48 y.o. female here for a routine exam.  Patient's last menstrual period was 11/09/2011. No obstetric history on file. Birth Control Method:  hysterectomy Menstrual Calendar(currently): amneorrheic  Current complaints: post coital spotting.   Current acute medical issues:  none   Recent Gynecologic History Patient's last menstrual period was 11/09/2011. Last Pap: 2017,  normal Last mammogram: 2019,  normal  Past Medical History:  Diagnosis Date  . Heart murmur   . PONV (postoperative nausea and vomiting)     Past Surgical History:  Procedure Laterality Date  . ABDOMINAL HYSTERECTOMY    . CHOLECYSTECTOMY    . CHOLECYSTECTOMY, LAPAROSCOPIC  2002  . DILATION AND CURETTAGE OF UTERUS  2002  . DILATION AND CURETTAGE OF UTERUS  2002  . SUPRACERVICAL ABDOMINAL HYSTERECTOMY  02/07/2012   Procedure: HYSTERECTOMY SUPRACERVICAL ABDOMINAL;  Surgeon: Florian Buff, MD;  Location: [facility];  Service: Gynecology;  Laterality: N/A;    OB History   No obstetric history on file.     Social History   Socioeconomic History  . Marital status: Single    Spouse name: Not on file  . Number of children: Not on file  . Years of education: Not on file  . Highest education level: Not on file  Occupational History  . Not on file  Social Needs  . Financial resource strain: Not on file  . Food insecurity:    Worry: Not on file    Inability: Not on file  . Transportation needs:    Medical: Not on file    Non-medical: Not on file  Tobacco Use  . Smoking status: Never Smoker  . Smokeless tobacco: Never Used  Substance and Sexual Activity  . Alcohol use: No  . Drug use: No  . Sexual activity: Yes  Lifestyle  . Physical activity:    Days per week: Not on file    Minutes per session: Not on file  . Stress: Not on file  Relationships  . Social connections:    Talks on phone: Not on file    Gets together: Not on file    Attends religious service: Not  on file    Active member of club or organization: Not on file    Attends meetings of clubs or organizations: Not on file    Relationship status: Not on file  Other Topics Concern  . Not on file  Social History Narrative  . Not on file    Family History  Problem Relation Age of Onset  . Cancer - Colon Mother   . Diabetes type II Father   . Hypertension Father      Current Outpatient Medications:  .  metroNIDAZOLE (METROGEL VAGINAL) 0.75 % vaginal gel, Nightly x 5 nights, Disp: 70 g, Rfl: 0  Review of Systems  Review of Systems  Constitutional: Negative for fever, chills, weight loss, malaise/fatigue and diaphoresis.  HENT: Negative for hearing loss, ear pain, nosebleeds, congestion, sore throat, neck pain, tinnitus and ear discharge.   Eyes: Negative for blurred vision, double vision, photophobia, pain, discharge and redness.  Respiratory: Negative for cough, hemoptysis, sputum production, shortness of breath, wheezing and stridor.   Cardiovascular: Negative for chest pain, palpitations, orthopnea, claudication, leg swelling and PND.  Gastrointestinal: negative for abdominal pain. Negative for heartburn, nausea, vomiting, diarrhea, constipation, blood in stool and melena.  Genitourinary: Negative for dysuria, urgency, frequency, hematuria and flank pain.  Musculoskeletal: Negative for myalgias, back pain,  joint pain and falls.  Skin: Negative for itching and rash.  Neurological: Negative for dizziness, tingling, tremors, sensory change, speech change, focal weakness, seizures, loss of consciousness, weakness and headaches.  Endo/Heme/Allergies: Negative for environmental allergies and polydipsia. Does not bruise/bleed easily.  Psychiatric/Behavioral: Negative for depression, suicidal ideas, hallucinations, memory loss and substance abuse. The patient is not nervous/anxious and does not have insomnia.        Objective:  Blood pressure 122/72, pulse 75, height 5\' 10"  (1.778 m),  weight 201 lb (91.2 kg), last menstrual period 11/09/2011.   Physical Exam  Vitals reviewed. Constitutional: She is oriented to person, place, and time. She appears well-developed and well-nourished.  HENT:  Head: Normocephalic and atraumatic.        Right Ear: External ear normal.  Left Ear: External ear normal.  Nose: Nose normal.  Mouth/Throat: Oropharynx is clear and moist.  Eyes: Conjunctivae and EOM are normal. Pupils are equal, round, and reactive to light. Right eye exhibits no discharge. Left eye exhibits no discharge. No scleral icterus.  Neck: Normal range of motion. Neck supple. No tracheal deviation present. No thyromegaly present.  Cardiovascular: Normal rate, regular rhythm, normal heart sounds and intact distal pulses.  Grade III/VI SEM blowing Respiratory: Effort normal and breath sounds normal. No respiratory distress. She has no wheezes. She has no rales. She exhibits no tenderness.  GI: Soft. Bowel sounds are normal. She exhibits no distension and no mass. There is no tenderness. There is no rebound and no guarding.  Genitourinary:  Breasts no masses skin changes or nipple changes bilaterally      Vulva is normal without lesions Vagina is pink moist ith discharge Cervix normal in appearance and pap is done Uterus is absent Adnexa is negative with normal sized ovaries   Musculoskeletal: Normal range of motion. She exhibits no edema and no tenderness.  Neurological: She is alert and oriented to person, place, and time. She has normal reflexes. She displays normal reflexes. No cranial nerve deficit. She exhibits normal muscle tone. Coordination normal.  Skin: Skin is warm and dry. No rash noted. No erythema. No pallor.  Psychiatric: She has a normal mood and affect. Her behavior is normal. Judgment and thought content normal.    Wet Prep:   A sample of vaginal discharge was obtained from the posterior fornix using a cotton swab. 2 drops of saline were placed on a slide  and the cotton swab was immersed in the saline. Microscopic evaluation was performed and results were as follows:  Negative  for yeast  Positive for clue cells , consistent with Bacterial vaginosis Negative for trichomonas  Normal WBC population   Whiff test: Negative      Medications Ordered at today's visit: Meds ordered this encounter  Medications  . metroNIDAZOLE (METROGEL VAGINAL) 0.75 % vaginal gel    Sig: Nightly x 5 nights    Dispense:  70 g    Refill:  0    Other orders placed at today's visit: No orders of the defined types were placed in this encounter.     Assessment:    Healthy female exam.   BV Plan:    Contraception: status post hysterectomy. Mammogram ordered. Follow up in: 3 years.     Return in about 3 years (around 02/18/2021) for yearly.

## 2018-02-22 LAB — CYTOLOGY - PAP
Chlamydia: NEGATIVE
DIAGNOSIS: NEGATIVE
HPV (WINDOPATH): NOT DETECTED
Neisseria Gonorrhea: NEGATIVE

## 2018-10-15 ENCOUNTER — Other Ambulatory Visit: Payer: Self-pay | Admitting: Obstetrics & Gynecology

## 2018-10-15 DIAGNOSIS — Z1231 Encounter for screening mammogram for malignant neoplasm of breast: Secondary | ICD-10-CM

## 2018-10-22 DIAGNOSIS — Z0000 Encounter for general adult medical examination without abnormal findings: Secondary | ICD-10-CM | POA: Diagnosis not present

## 2018-10-22 DIAGNOSIS — Z6830 Body mass index (BMI) 30.0-30.9, adult: Secondary | ICD-10-CM | POA: Diagnosis not present

## 2018-11-22 DIAGNOSIS — Z0000 Encounter for general adult medical examination without abnormal findings: Secondary | ICD-10-CM | POA: Diagnosis not present

## 2018-11-29 ENCOUNTER — Other Ambulatory Visit: Payer: Self-pay

## 2018-11-29 ENCOUNTER — Ambulatory Visit
Admission: RE | Admit: 2018-11-29 | Discharge: 2018-11-29 | Disposition: A | Discharge status: Home / Self Care | Payer: PRIVATE HEALTH INSURANCE | Source: Ambulatory Visit | Attending: Obstetrics & Gynecology | Admitting: Obstetrics & Gynecology

## 2018-11-29 DIAGNOSIS — Z1231 Encounter for screening mammogram for malignant neoplasm of breast: Secondary | ICD-10-CM | POA: Diagnosis not present

## 2018-12-04 DIAGNOSIS — Z6830 Body mass index (BMI) 30.0-30.9, adult: Secondary | ICD-10-CM | POA: Diagnosis not present

## 2018-12-04 DIAGNOSIS — L500 Allergic urticaria: Secondary | ICD-10-CM | POA: Diagnosis not present

## 2019-11-21 ENCOUNTER — Other Ambulatory Visit: Payer: Self-pay | Admitting: Internal Medicine

## 2019-11-21 DIAGNOSIS — Z1231 Encounter for screening mammogram for malignant neoplasm of breast: Secondary | ICD-10-CM

## 2019-12-03 ENCOUNTER — Ambulatory Visit: Payer: BLUE CROSS/BLUE SHIELD

## 2020-02-13 ENCOUNTER — Ambulatory Visit: Payer: BLUE CROSS/BLUE SHIELD

## 2021-01-28 DIAGNOSIS — S161XXA Strain of muscle, fascia and tendon at neck level, initial encounter: Secondary | ICD-10-CM | POA: Diagnosis not present

## 2021-01-28 DIAGNOSIS — S46912A Strain of unspecified muscle, fascia and tendon at shoulder and upper arm level, left arm, initial encounter: Secondary | ICD-10-CM | POA: Diagnosis not present

## 2021-01-28 DIAGNOSIS — M542 Cervicalgia: Secondary | ICD-10-CM | POA: Diagnosis not present

## 2021-01-28 DIAGNOSIS — Z880 Allergy status to penicillin: Secondary | ICD-10-CM | POA: Diagnosis not present

## 2021-02-01 ENCOUNTER — Other Ambulatory Visit: Payer: Self-pay | Admitting: Obstetrics & Gynecology

## 2021-02-01 DIAGNOSIS — Z1231 Encounter for screening mammogram for malignant neoplasm of breast: Secondary | ICD-10-CM

## 2021-02-03 DIAGNOSIS — Z79899 Other long term (current) drug therapy: Secondary | ICD-10-CM | POA: Diagnosis not present

## 2021-02-03 DIAGNOSIS — E559 Vitamin D deficiency, unspecified: Secondary | ICD-10-CM | POA: Diagnosis not present

## 2021-02-03 DIAGNOSIS — Z0000 Encounter for general adult medical examination without abnormal findings: Secondary | ICD-10-CM | POA: Diagnosis not present

## 2021-02-03 DIAGNOSIS — Z6833 Body mass index (BMI) 33.0-33.9, adult: Secondary | ICD-10-CM | POA: Diagnosis not present

## 2021-02-03 DIAGNOSIS — E7849 Other hyperlipidemia: Secondary | ICD-10-CM | POA: Diagnosis not present

## 2021-02-22 ENCOUNTER — Ambulatory Visit
Admission: RE | Admit: 2021-02-22 | Discharge: 2021-02-22 | Disposition: A | Discharge status: Home / Self Care | Payer: BC Managed Care – PPO | Source: Ambulatory Visit | Attending: Obstetrics & Gynecology | Admitting: Obstetrics & Gynecology

## 2021-02-22 DIAGNOSIS — Z1231 Encounter for screening mammogram for malignant neoplasm of breast: Secondary | ICD-10-CM

## 2021-02-28 ENCOUNTER — Other Ambulatory Visit (HOSPITAL_COMMUNITY)
Admission: RE | Admit: 2021-02-28 | Discharge: 2021-02-28 | Disposition: A | Discharge status: Home / Self Care | Payer: BC Managed Care – PPO | Source: Ambulatory Visit | Attending: Obstetrics & Gynecology | Admitting: Obstetrics & Gynecology

## 2021-02-28 ENCOUNTER — Ambulatory Visit (INDEPENDENT_AMBULATORY_CARE_PROVIDER_SITE_OTHER): Payer: BC Managed Care – PPO | Admitting: Obstetrics & Gynecology

## 2021-02-28 ENCOUNTER — Encounter: Payer: Self-pay | Admitting: Obstetrics & Gynecology

## 2021-02-28 ENCOUNTER — Other Ambulatory Visit: Payer: Self-pay

## 2021-02-28 VITALS — BP 96/65 | HR 72 | Ht 71.0 in | Wt 234.0 lb

## 2021-02-28 DIAGNOSIS — Z01419 Encounter for gynecological examination (general) (routine) without abnormal findings: Secondary | ICD-10-CM | POA: Insufficient documentation

## 2021-02-28 NOTE — Progress Notes (Signed)
Subjective:     Julie Vega is a 51 y.o. female here for a routine exam.  Patient's last menstrual period was 11/09/2011. G3P0 Birth Control Method:  hysterectomy Menstrual Calendar(currently): amenorrheic  Current complaints: none.   Current acute medical issues:  none   Recent Gynecologic History Patient's last menstrual period was 11/09/2011. Last Pap: 2020,  normal Last mammogram: 1/23,  normal  Past Medical History:  Diagnosis Date   Heart murmur    PONV (postoperative nausea and vomiting)     Past Surgical History:  Procedure Laterality Date   ABDOMINAL HYSTERECTOMY     CHOLECYSTECTOMY     CHOLECYSTECTOMY, LAPAROSCOPIC  2002   DILATION AND CURETTAGE OF UTERUS  2002   DILATION AND CURETTAGE OF UTERUS  2002   SUPRACERVICAL ABDOMINAL HYSTERECTOMY  02/07/2012   Procedure: HYSTERECTOMY SUPRACERVICAL ABDOMINAL;  Surgeon: Florian Buff, MD;  Location: [facility];  Service: Gynecology;  Laterality: N/A;    OB History     Gravida  3   Para      Term      Preterm      AB      Living         SAB      IAB      Ectopic      Multiple      Live Births              Social History   Socioeconomic History   Marital status: Single    Spouse name: Not on file   Number of children: Not on file   Years of education: Not on file   Highest education level: Not on file  Occupational History   Not on file  Tobacco Use   Smoking status: Never   Smokeless tobacco: Never  Vaping Use   Vaping Use: Never used  Substance and Sexual Activity   Alcohol use: No   Drug use: No   Sexual activity: Yes  Other Topics Concern   Not on file  Social History Narrative   Not on file   Social Determinants of Health   Financial Resource Strain: Low Risk    Difficulty of Paying Living Expenses: Not very hard  Food Insecurity: Food Insecurity Present   Worried About Running Out of Food in the Last Year: Sometimes true   Ran Out of Food in the Last Year: Sometimes true   Transportation Needs: No Transportation Needs   Lack of Transportation (Medical): No   Lack of Transportation (Non-Medical): No  Physical Activity: Inactive   Days of Exercise per Week: 0 days   Minutes of Exercise per Session: 0 min  Stress: No Stress Concern Present   Feeling of Stress : Not at all  Social Connections: Socially Isolated   Frequency of Communication with Friends and Family: Once a week   Frequency of Social Gatherings with Friends and Family: Once a week   Attends Religious Services: Never   Marine scientist or Organizations: No   Attends Archivist Meetings: Never   Marital Status: Widowed    Family History  Problem Relation Age of Onset   Cancer - Colon Mother    Diabetes type II Father    Hypertension Father      Current Outpatient Medications:    Cholecalciferol (VITAMIN D3) 100000 UNIT/GM POWD, 3,000 Units daily., Disp: , Rfl:    meloxicam (MOBIC) 15 MG tablet, Take 15 mg by mouth daily., Disp: , Rfl:  metroNIDAZOLE (METROGEL VAGINAL) 0.75 % vaginal gel, Nightly x 5 nights (Patient not taking: Reported on 02/28/2021), Disp: 70 g, Rfl: 0  Review of Systems  Review of Systems  Constitutional: Negative for fever, chills, weight loss, malaise/fatigue and diaphoresis.  HENT: Negative for hearing loss, ear pain, nosebleeds, congestion, sore throat, neck pain, tinnitus and ear discharge.   Eyes: Negative for blurred vision, double vision, photophobia, pain, discharge and redness.  Respiratory: Negative for cough, hemoptysis, sputum production, shortness of breath, wheezing and stridor.   Cardiovascular: Negative for chest pain, palpitations, orthopnea, claudication, leg swelling and PND.  Gastrointestinal: negative for abdominal pain. Negative for heartburn, nausea, vomiting, diarrhea, constipation, blood in stool and melena.  Genitourinary: Negative for dysuria, urgency, frequency, hematuria and flank pain.  Musculoskeletal: Negative for  myalgias, back pain, joint pain and falls.  Skin: Negative for itching and rash.  Neurological: Negative for dizziness, tingling, tremors, sensory change, speech change, focal weakness, seizures, loss of consciousness, weakness and headaches.  Endo/Heme/Allergies: Negative for environmental allergies and polydipsia. Does not bruise/bleed easily.  Psychiatric/Behavioral: Negative for depression, suicidal ideas, hallucinations, memory loss and substance abuse. The patient is not nervous/anxious and does not have insomnia.        Objective:  Blood pressure 96/65, pulse 72, height 5\' 11"  (1.803 m), weight 234 lb (106.1 kg), last menstrual period 11/09/2011.   Physical Exam  Vitals reviewed. Constitutional: She is oriented to person, place, and time. She appears well-developed and well-nourished.  HENT:  Head: Normocephalic and atraumatic.        Right Ear: External ear normal.  Left Ear: External ear normal.  Nose: Nose normal.  Mouth/Throat: Oropharynx is clear and moist.  Eyes: Conjunctivae and EOM are normal. Pupils are equal, round, and reactive to light. Right eye exhibits no discharge. Left eye exhibits no discharge. No scleral icterus.  Neck: Normal range of motion. Neck supple. No tracheal deviation present. No thyromegaly present.  Cardiovascular: Normal rate, regular rhythm, normal heart sounds and intact distal pulses.  Exam reveals no gallop and no friction rub.   No murmur heard. Respiratory: Effort normal and breath sounds normal. No respiratory distress. She has no wheezes. She has no rales. She exhibits no tenderness.  GI: Soft. Bowel sounds are normal. She exhibits no distension and no mass. There is no tenderness. There is no rebound and no guarding.  Genitourinary:  Breasts no masses skin changes or nipple changes bilaterally      Vulva is normal without lesions Vagina is pink moist without discharge Cervix normal in appearance and pap is done Uterus is absent Adnexa is  negative with normal sized ovaries = = Musculoskeletal: Normal range of motion. She exhibits no edema and no tenderness.  Neurological: She is alert and oriented to person, place, and time. She has normal reflexes. She displays normal reflexes. No cranial nerve deficit. She exhibits normal muscle tone. Coordination normal.  Skin: Skin is warm and dry. No rash noted. No erythema. No pallor.  Psychiatric: She has a normal mood and affect. Her behavior is normal. Judgment and thought content normal.       Medications Ordered at today's visit: No orders of the defined types were placed in this encounter.   Other orders placed at today's visit: No orders of the defined types were placed in this encounter.     Assessment:    Normal Gyn exam.   OAB-->recommend decaffeinating Plan:    Contraception: status post hysterectomy. Mammogram ordered. Follow up in: 5  years.     Return in about 5 years (around 02/28/2026) for yearly.

## 2021-03-02 LAB — CYTOLOGY - PAP
Chlamydia: NEGATIVE
Comment: NEGATIVE
Comment: NEGATIVE
Comment: NORMAL
Diagnosis: NEGATIVE
High risk HPV: NEGATIVE
Neisseria Gonorrhea: NEGATIVE

## 2021-03-24 DIAGNOSIS — Z6834 Body mass index (BMI) 34.0-34.9, adult: Secondary | ICD-10-CM | POA: Diagnosis not present

## 2021-03-24 DIAGNOSIS — S43492A Other sprain of left shoulder joint, initial encounter: Secondary | ICD-10-CM | POA: Diagnosis not present

## 2021-03-24 DIAGNOSIS — S3992XA Unspecified injury of lower back, initial encounter: Secondary | ICD-10-CM | POA: Diagnosis not present

## 2021-03-24 DIAGNOSIS — M542 Cervicalgia: Secondary | ICD-10-CM | POA: Diagnosis not present

## 2021-03-28 DIAGNOSIS — M542 Cervicalgia: Secondary | ICD-10-CM | POA: Diagnosis not present

## 2021-03-28 DIAGNOSIS — Z7409 Other reduced mobility: Secondary | ICD-10-CM | POA: Diagnosis not present

## 2021-03-28 DIAGNOSIS — Z789 Other specified health status: Secondary | ICD-10-CM | POA: Diagnosis not present

## 2021-03-28 DIAGNOSIS — M2569 Stiffness of other specified joint, not elsewhere classified: Secondary | ICD-10-CM | POA: Diagnosis not present

## 2021-03-28 DIAGNOSIS — R29898 Other symptoms and signs involving the musculoskeletal system: Secondary | ICD-10-CM | POA: Diagnosis not present

## 2021-03-30 DIAGNOSIS — M2569 Stiffness of other specified joint, not elsewhere classified: Secondary | ICD-10-CM | POA: Diagnosis not present

## 2021-03-30 DIAGNOSIS — M542 Cervicalgia: Secondary | ICD-10-CM | POA: Diagnosis not present

## 2021-03-30 DIAGNOSIS — Z789 Other specified health status: Secondary | ICD-10-CM | POA: Diagnosis not present

## 2021-03-30 DIAGNOSIS — Z7409 Other reduced mobility: Secondary | ICD-10-CM | POA: Diagnosis not present

## 2021-03-30 DIAGNOSIS — R29898 Other symptoms and signs involving the musculoskeletal system: Secondary | ICD-10-CM | POA: Diagnosis not present

## 2021-04-04 DIAGNOSIS — Z789 Other specified health status: Secondary | ICD-10-CM | POA: Diagnosis not present

## 2021-04-04 DIAGNOSIS — Z7409 Other reduced mobility: Secondary | ICD-10-CM | POA: Diagnosis not present

## 2021-04-04 DIAGNOSIS — M2569 Stiffness of other specified joint, not elsewhere classified: Secondary | ICD-10-CM | POA: Diagnosis not present

## 2021-04-04 DIAGNOSIS — M542 Cervicalgia: Secondary | ICD-10-CM | POA: Diagnosis not present

## 2021-04-04 DIAGNOSIS — R29898 Other symptoms and signs involving the musculoskeletal system: Secondary | ICD-10-CM | POA: Diagnosis not present

## 2021-04-06 DIAGNOSIS — M2569 Stiffness of other specified joint, not elsewhere classified: Secondary | ICD-10-CM | POA: Diagnosis not present

## 2021-04-06 DIAGNOSIS — Z789 Other specified health status: Secondary | ICD-10-CM | POA: Diagnosis not present

## 2021-04-06 DIAGNOSIS — R29898 Other symptoms and signs involving the musculoskeletal system: Secondary | ICD-10-CM | POA: Diagnosis not present

## 2021-04-06 DIAGNOSIS — Z7409 Other reduced mobility: Secondary | ICD-10-CM | POA: Diagnosis not present

## 2021-04-06 DIAGNOSIS — M542 Cervicalgia: Secondary | ICD-10-CM | POA: Diagnosis not present

## 2021-04-07 DIAGNOSIS — M542 Cervicalgia: Secondary | ICD-10-CM | POA: Diagnosis not present

## 2021-04-07 DIAGNOSIS — M545 Low back pain, unspecified: Secondary | ICD-10-CM | POA: Diagnosis not present

## 2021-04-07 DIAGNOSIS — Z6833 Body mass index (BMI) 33.0-33.9, adult: Secondary | ICD-10-CM | POA: Diagnosis not present

## 2021-04-13 DIAGNOSIS — M542 Cervicalgia: Secondary | ICD-10-CM | POA: Diagnosis not present

## 2021-04-13 DIAGNOSIS — Z789 Other specified health status: Secondary | ICD-10-CM | POA: Diagnosis not present

## 2021-04-13 DIAGNOSIS — M2569 Stiffness of other specified joint, not elsewhere classified: Secondary | ICD-10-CM | POA: Diagnosis not present

## 2021-04-13 DIAGNOSIS — Z7409 Other reduced mobility: Secondary | ICD-10-CM | POA: Diagnosis not present

## 2021-04-13 DIAGNOSIS — R29898 Other symptoms and signs involving the musculoskeletal system: Secondary | ICD-10-CM | POA: Diagnosis not present

## 2021-04-14 DIAGNOSIS — M542 Cervicalgia: Secondary | ICD-10-CM | POA: Diagnosis not present

## 2021-04-14 DIAGNOSIS — M545 Low back pain, unspecified: Secondary | ICD-10-CM | POA: Diagnosis not present

## 2021-04-14 DIAGNOSIS — Z6833 Body mass index (BMI) 33.0-33.9, adult: Secondary | ICD-10-CM | POA: Diagnosis not present

## 2021-08-17 DIAGNOSIS — Z0000 Encounter for general adult medical examination without abnormal findings: Secondary | ICD-10-CM | POA: Diagnosis not present

## 2021-08-17 DIAGNOSIS — M542 Cervicalgia: Secondary | ICD-10-CM | POA: Diagnosis not present

## 2021-08-17 DIAGNOSIS — E7849 Other hyperlipidemia: Secondary | ICD-10-CM | POA: Diagnosis not present

## 2021-08-17 DIAGNOSIS — M545 Low back pain, unspecified: Secondary | ICD-10-CM | POA: Diagnosis not present

## 2021-08-17 DIAGNOSIS — Z6834 Body mass index (BMI) 34.0-34.9, adult: Secondary | ICD-10-CM | POA: Diagnosis not present

## 2022-04-21 ENCOUNTER — Other Ambulatory Visit: Payer: Self-pay | Admitting: Obstetrics & Gynecology

## 2022-04-21 DIAGNOSIS — Z1231 Encounter for screening mammogram for malignant neoplasm of breast: Secondary | ICD-10-CM

## 2022-05-17 DIAGNOSIS — Z6834 Body mass index (BMI) 34.0-34.9, adult: Secondary | ICD-10-CM | POA: Diagnosis not present

## 2022-05-17 DIAGNOSIS — M25579 Pain in unspecified ankle and joints of unspecified foot: Secondary | ICD-10-CM | POA: Diagnosis not present

## 2022-05-17 DIAGNOSIS — E669 Obesity, unspecified: Secondary | ICD-10-CM | POA: Diagnosis not present

## 2022-05-17 DIAGNOSIS — Z0000 Encounter for general adult medical examination without abnormal findings: Secondary | ICD-10-CM | POA: Diagnosis not present

## 2022-06-06 ENCOUNTER — Ambulatory Visit: Payer: BC Managed Care – PPO

## 2022-06-21 ENCOUNTER — Ambulatory Visit
Admission: RE | Admit: 2022-06-21 | Discharge: 2022-06-21 | Disposition: A | Discharge status: Home / Self Care | Payer: BC Managed Care – PPO | Source: Ambulatory Visit | Attending: Obstetrics & Gynecology | Admitting: Obstetrics & Gynecology

## 2022-06-21 DIAGNOSIS — Z1231 Encounter for screening mammogram for malignant neoplasm of breast: Secondary | ICD-10-CM

## 2022-10-26 DIAGNOSIS — Z0000 Encounter for general adult medical examination without abnormal findings: Secondary | ICD-10-CM | POA: Diagnosis not present

## 2022-10-26 DIAGNOSIS — M25579 Pain in unspecified ankle and joints of unspecified foot: Secondary | ICD-10-CM | POA: Diagnosis not present

## 2022-10-26 DIAGNOSIS — Z6831 Body mass index (BMI) 31.0-31.9, adult: Secondary | ICD-10-CM | POA: Diagnosis not present

## 2022-10-26 DIAGNOSIS — E669 Obesity, unspecified: Secondary | ICD-10-CM | POA: Diagnosis not present

## 2022-10-31 ENCOUNTER — Encounter (INDEPENDENT_AMBULATORY_CARE_PROVIDER_SITE_OTHER): Payer: Self-pay | Admitting: *Deleted

## 2022-12-11 ENCOUNTER — Telehealth (INDEPENDENT_AMBULATORY_CARE_PROVIDER_SITE_OTHER): Payer: Self-pay | Admitting: *Deleted

## 2022-12-11 NOTE — Telephone Encounter (Signed)
Who is your primary care physician: Dr. Olena Leatherwood  Reasons for the colonoscopy: Screening  Have you had a colonoscopy before?  no  Do you have family history of colon cancer? yes  Previous colonoscopy with polyps removed?   Do you have a history colorectal cancer?   no  Are you diabetic? If yes, Type 1 or Type 2?    no  Do you have a prosthetic or mechanical heart valve? no  Do you have a pacemaker/defibrillator?   no  Have you had endocarditis/atrial fibrillation? no  Have you had joint replacement within the last 12 months?  no  Do you tend to be constipated or have to use laxatives? no  Do you have any history of drugs or alchohol?  no  Do you use supplemental oxygen?  no  Have you had a stroke or heart attack within the last 6 months? no  Do you take weight loss medication?  yes  For female patients: have you had a hysterectomy?  yes                                     are you post menopausal?                                                   do you still have your menstrual cycle? no      Do you take any blood-thinning medications such as: (aspirin, warfarin, Plavix, Aggrenox)  no  If yes we need the name, milligram, dosage and who is prescribing doctor  Current Outpatient Medications on File Prior to Visit  Medication Sig Dispense Refill   phentermine (ADIPEX-P) 37.5 MG tablet Take 37.5 mg by mouth every morning.     No current facility-administered medications on file prior to visit.    Allergies  Allergen Reactions   Penicillins Hives     Pharmacy: [facility]  Best number where you can be reached: [phone]

## 2022-12-15 NOTE — Telephone Encounter (Signed)
LMOVM to call back 

## 2022-12-18 ENCOUNTER — Encounter (INDEPENDENT_AMBULATORY_CARE_PROVIDER_SITE_OTHER): Payer: Self-pay | Admitting: *Deleted

## 2022-12-18 MED ORDER — PEG 3350-KCL-NA BICARB-NACL 420 G PO SOLR: 4000.0000 mL | Freq: Once | ORAL | 0 refills | Status: AC

## 2022-12-18 NOTE — Telephone Encounter (Signed)
Referral completed, [facility] apt letter sent to PCP

## 2022-12-18 NOTE — Telephone Encounter (Signed)
Pt left voicemail 12/15/22 but I was unable to return her call before closing on Friday.  Returned call to pt this morning. Pt has been scheduled with Dr.Ahmed for 01/11/23 at 2 pm. Prep sent to pharmacy. Instructions will be mailed once pre op is received. No PA needed per insurance.

## 2022-12-18 NOTE — Addendum Note (Signed)
Addended by: Marlowe Shores on: 12/18/2022 08:44 AM   Modules accepted: Orders

## 2023-01-09 ENCOUNTER — Other Ambulatory Visit: Payer: Self-pay

## 2023-01-09 ENCOUNTER — Encounter (HOSPITAL_COMMUNITY): Payer: Self-pay

## 2023-01-09 ENCOUNTER — Encounter (HOSPITAL_COMMUNITY)
Admission: RE | Admit: 2023-01-09 | Discharge: 2023-01-09 | Disposition: A | Discharge status: Home / Self Care | Payer: BC Managed Care – PPO | Source: Ambulatory Visit | Attending: Gastroenterology | Admitting: Gastroenterology

## 2023-01-11 ENCOUNTER — Other Ambulatory Visit: Payer: Self-pay

## 2023-01-11 ENCOUNTER — Ambulatory Visit (HOSPITAL_COMMUNITY): Payer: BC Managed Care – PPO | Admitting: Anesthesiology

## 2023-01-11 ENCOUNTER — Encounter (HOSPITAL_COMMUNITY)
Admission: RE | Disposition: A | Discharge status: Home / Self Care | Payer: Self-pay | Source: Home / Self Care | Attending: Gastroenterology

## 2023-01-11 ENCOUNTER — Ambulatory Visit (HOSPITAL_COMMUNITY)
Admission: RE | Admit: 2023-01-11 | Discharge: 2023-01-11 | Disposition: A | Discharge status: Home / Self Care | Payer: BC Managed Care – PPO | Attending: Gastroenterology | Admitting: Gastroenterology

## 2023-01-11 ENCOUNTER — Encounter (HOSPITAL_COMMUNITY): Payer: Self-pay

## 2023-01-11 DIAGNOSIS — K648 Other hemorrhoids: Secondary | ICD-10-CM | POA: Insufficient documentation

## 2023-01-11 DIAGNOSIS — Z1211 Encounter for screening for malignant neoplasm of colon: Secondary | ICD-10-CM | POA: Insufficient documentation

## 2023-01-11 DIAGNOSIS — Z800 Family history of malignant neoplasm of digestive organs: Secondary | ICD-10-CM | POA: Diagnosis not present

## 2023-01-11 DIAGNOSIS — I10 Essential (primary) hypertension: Secondary | ICD-10-CM | POA: Diagnosis not present

## 2023-01-11 HISTORY — PX: COLONOSCOPY WITH PROPOFOL: SHX5780

## 2023-01-11 SURGERY — COLONOSCOPY WITH PROPOFOL
Anesthesia: Monitor Anesthesia Care

## 2023-01-11 MED ORDER — EPHEDRINE SULFATE-NACL 50-0.9 MG/10ML-% IV SOSY
PREFILLED_SYRINGE | INTRAVENOUS | Status: DC | PRN
  Administered 2023-01-11: 10 mg via INTRAVENOUS

## 2023-01-11 MED ORDER — PROPOFOL 10 MG/ML IV BOLUS
INTRAVENOUS | Status: DC | PRN
  Administered 2023-01-11: 60 mg via INTRAVENOUS
  Administered 2023-01-11: 20 mg via INTRAVENOUS

## 2023-01-11 MED ORDER — PHENYLEPHRINE 80 MCG/ML (10ML) SYRINGE FOR IV PUSH (FOR BLOOD PRESSURE SUPPORT)
PREFILLED_SYRINGE | INTRAVENOUS | Status: DC | PRN
  Administered 2023-01-11 (×3): 160 ug via INTRAVENOUS

## 2023-01-11 MED ORDER — PROPOFOL 500 MG/50ML IV EMUL
INTRAVENOUS | Status: DC | PRN
  Administered 2023-01-11: 150 ug/kg/min via INTRAVENOUS

## 2023-01-11 MED ORDER — LACTATED RINGERS IV SOLN: INTRAVENOUS | Status: DC

## 2023-01-11 NOTE — Transfer of Care (Signed)
Immediate Anesthesia Transfer of Care Note  Patient: Julie Vega  Procedure(s) Performed: COLONOSCOPY WITH PROPOFOL  Patient Location: PACU  Anesthesia Type:General  Level of Consciousness: awake, alert , and oriented  Airway & Oxygen Therapy: Patient Spontanous Breathing  Post-op Assessment: Report given to RN and Post -op Vital signs reviewed and stable  Post vital signs: Reviewed and stable  Last Vitals:  Vitals Value Taken Time  BP 93/52 01/11/23 1319  Temp 36.6 C 01/11/23 1319  Pulse 81 01/11/23 1319  Resp 18 01/11/23 1319  SpO2 100 % 01/11/23 1319    Last Pain:  Vitals:   01/11/23 1319  TempSrc:   PainSc: 0-No pain         Complications: No notable events documented.

## 2023-01-11 NOTE — Discharge Instructions (Signed)

## 2023-01-11 NOTE — Op Note (Signed)
[facility] Patient Name: Julie Vega Procedure Date: 01/11/2023 12:39 PM MRN: [id number] Date of Birth: 1970/12/09 Attending MD: Sanjuan Dame , MD, [id number] CSN: [id number] Age: 52 Admit Type: Outpatient Procedure:                Colonoscopy Indications:              Screening in patient at increased risk: Family                            history of 1st-degree relative with colorectal                            cancer before age 50 years Providers:                Sanjuan Dame, MD, Darlisha Page, Kristine L. Jessee Avers, Technician Referring MD:              Medicines:                Monitored Anesthesia Care Complications:            No immediate complications. Estimated Blood Loss:     Estimated blood loss: none. Procedure:                Pre-Anesthesia Assessment:                           - Prior to the procedure, a History and Physical                            was performed, and patient medications and                            allergies were reviewed. The patient's tolerance of                            previous anesthesia was also reviewed. The risks                            and benefits of the procedure and the sedation                            options and risks were discussed with the patient.                            All questions were answered, and informed consent                            was obtained. Prior Anticoagulants: The patient has                            taken no anticoagulant or antiplatelet agents. ASA  Grade Assessment: II - A patient with mild systemic                            disease. After reviewing the risks and benefits,                            the patient was deemed in satisfactory condition to                            undergo the procedure.                           After obtaining informed consent, the colonoscope                            was passed under direct  vision. Throughout the                            procedure, the patient's blood pressure, pulse, and                            oxygen saturations were monitored continuously. The                            [id number]-[id number] ([id number]) scope was introduced through                            the anus and advanced to the the cecum, identified                            by appendiceal orifice and ileocecal valve. The                            colonoscopy was performed without difficulty. The                            patient tolerated the procedure well. The quality                            of the bowel preparation was evaluated using the                            BBPS [facility] Bowel Preparation Scale) with scores                            of: Right Colon = 3, Transverse Colon = 3 and Left                            Colon = 3 (entire mucosa seen well with no residual                            staining, small fragments of stool or opaque  liquid). The total BBPS score equals 9. The                            ileocecal valve, appendiceal orifice, and rectum                            were photographed. Scope In: 12:59:33 PM Scope Out: 1:15:41 PM Scope Withdrawal Time: 0 hours 14 minutes 13 seconds  Total Procedure Duration: 0 hours 16 minutes 8 seconds  Findings:      The perianal and digital rectal examinations were normal.      The entire examined colon appeared normal on direct and retroflexion       views.      Non-bleeding internal hemorrhoids were found during retroflexion. The       hemorrhoids were small. Impression:               - The entire examined colon is normal on direct and                            retroflexion views.                           - Non-bleeding internal hemorrhoids.                           - No specimens collected. Moderate Sedation:      Per Anesthesia Care Recommendation:           - Patient has a contact number available for                             emergencies. The signs and symptoms of potential                            delayed complications were discussed with the                            patient. Return to normal activities tomorrow.                            Written discharge instructions were provided to the                            patient.                           - Resume previous diet.                           - Continue present medications.                           - Repeat colonoscopy in 5 years for screening                            purposes.                           -  Return to primary care physician as previously                            scheduled. Procedure Code(s):        --- Professional ---                           V4259, Colorectal cancer screening; colonoscopy on                            individual at high risk Diagnosis Code(s):        --- Professional ---                           Z80.0, Family history of malignant neoplasm of                            digestive organs                           K64.8, Other hemorrhoids CPT copyright 2022 American Medical Association. All rights reserved. The codes documented in this report are preliminary and upon coder review may  be revised to meet current compliance requirements. Sanjuan Dame, MD Sanjuan Dame, MD 01/11/2023 1:19:18 PM This report has been signed electronically. Number of Addenda: 0

## 2023-01-11 NOTE — H&P (Signed)
Primary Care Physician:  Toma Deiters, MD Primary Gastroenterologist:  Dr. Tasia Catchings  Pre-Procedure History & Physical: HPI:  Julie Vega is a 52 y.o. female is here for a colonoscopy for colon cancer screening purposes.    No melena or hematochezia.  No abdominal pain or unintentional weight loss.  No change in bowel habits.  Overall feels well from a GI standpoint.  Mother had colon cancer at age 93  Past Medical History:  Diagnosis Date   Heart murmur    PONV (postoperative nausea and vomiting)     Past Surgical History:  Procedure Laterality Date   ABDOMINAL HYSTERECTOMY     CHOLECYSTECTOMY     CHOLECYSTECTOMY, LAPAROSCOPIC  2002   DILATION AND CURETTAGE OF UTERUS  2002   DILATION AND CURETTAGE OF UTERUS  2002   SUPRACERVICAL ABDOMINAL HYSTERECTOMY  02/07/2012   Procedure: HYSTERECTOMY SUPRACERVICAL ABDOMINAL;  Surgeon: Lazaro Arms, MD;  Location: [facility];  Service: Gynecology;  Laterality: N/A;    Prior to Admission medications   Medication Sig Start Date End Date Taking? Authorizing Provider  phentermine (ADIPEX-P) 37.5 MG tablet Take 37.5 mg by mouth every morning. 10/28/22   [provider]    Allergies as of 12/18/2022 - Review Complete 12/11/2022  Allergen Reaction Noted   Penicillins Hives 02/28/2021    Family History  Problem Relation Age of Onset   Cancer - Colon Mother    Diabetes type II Father    Hypertension Father     Social History   Socioeconomic History   Marital status: Single    Spouse name: Not on file   Number of children: Not on file   Years of education: Not on file   Highest education level: Not on file  Occupational History   Not on file  Tobacco Use   Smoking status: Never   Smokeless tobacco: Never  Vaping Use   Vaping status: Never Used  Substance and Sexual Activity   Alcohol use: No   Drug use: No   Sexual activity: Yes  Other Topics Concern   Not on file  Social History Narrative   Not on file   Social  Drivers of Health   Financial Resource Strain: Low Risk  (02/28/2021)   Overall Financial Resource Strain (CARDIA)    Difficulty of Paying Living Expenses: Not very hard  Food Insecurity: Food Insecurity Present (02/28/2021)   Hunger Vital Sign    Worried About Running Out of Food in the Last Year: Sometimes true    Ran Out of Food in the Last Year: Sometimes true  Transportation Needs: No Transportation Needs (02/28/2021)   PRAPARE - Administrator, Civil Service (Medical): No    Lack of Transportation (Non-Medical): No  Physical Activity: Inactive (02/28/2021)   Exercise Vital Sign    Days of Exercise per Week: 0 days    Minutes of Exercise per Session: 0 min  Stress: No Stress Concern Present (02/28/2021)   [employer] of Occupational Health - Occupational Stress Questionnaire    Feeling of Stress : Not at all  Social Connections: Unknown (06/14/2021)   Received from [facility], [facility]   Social Network    Social Network: Not on file  Intimate Partner Violence: Unknown (05/06/2021)   Received from [facility], [facility]   HITS    Physically Hurt: Not on file    Insult or Talk Down To: Not on file    Threaten Physical Harm: Not on file  Scream or Curse: Not on file    Review of Systems: See HPI, otherwise negative ROS  Physical Exam: Vital signs in last 24 hours: Temp:  [97.9 F (36.6 C)] 97.9 F (36.6 C) (12/12 1227) Pulse Rate:  [67] 67 (12/12 1227) Resp:  [10] 10 (12/12 1227) BP: (110)/(77) 110/77 (12/12 1227) SpO2:  [100 %] 100 % (12/12 1227) Weight:  [98.9 kg] 98.9 kg (12/12 1227)   General:   Alert,  Well-developed, well-nourished, pleasant and cooperative in NAD Head:  Normocephalic and atraumatic. Eyes:  Sclera clear, no icterus.   Conjunctiva pink. Ears:  Normal auditory acuity. Nose:  No deformity, discharge,  or lesions. Msk:  Symmetrical without gross deformities. Normal posture. Extremities:  Without clubbing or  edema. Neurologic:  Alert and  oriented x4;  grossly normal neurologically. Skin:  Intact without significant lesions or rashes. Psych:  Alert and cooperative. Normal mood and affect.  Impression/Plan: Julie Vega is here for a colonoscopy to be performed for colon cancer screening purposes.  The risks of the procedure including infection, bleed, or perforation as well as benefits, limitations, alternatives and imponderables have been reviewed with the patient. Questions have been answered. All parties agreeable.

## 2023-01-14 NOTE — Anesthesia Preprocedure Evaluation (Signed)
Anesthesia Evaluation  Patient identified by MRN, date of birth, ID band Patient awake    Reviewed: Allergy & Precautions, H&P , NPO status , Patient's Chart, lab work & pertinent test results, reviewed documented beta blocker date and time   History of Anesthesia Complications (+) PONV and history of anesthetic complications  Airway Mallampati: II  TM Distance: >3 FB Neck ROM: full    Dental no notable dental hx.    Pulmonary neg pulmonary ROS   Pulmonary exam normal breath sounds clear to auscultation       Cardiovascular Exercise Tolerance: Good hypertension, + Valvular Problems/Murmurs  Rhythm:regular Rate:Normal     Neuro/Psych negative neurological ROS  negative psych ROS   GI/Hepatic negative GI ROS, Neg liver ROS,,,  Endo/Other  negative endocrine ROS    Renal/GU negative Renal ROS  negative genitourinary   Musculoskeletal   Abdominal   Peds  Hematology negative hematology ROS (+)   Anesthesia Other Findings   Reproductive/Obstetrics negative OB ROS                             Anesthesia Physical Anesthesia Plan  ASA: 2  Anesthesia Plan: General   Post-op Pain Management:    Induction:   PONV Risk Score and Plan: Propofol infusion  Airway Management Planned:   Additional Equipment:   Intra-op Plan:   Post-operative Plan:   Informed Consent: I have reviewed the patients History and Physical, chart, labs and discussed the procedure including the risks, benefits and alternatives for the proposed anesthesia with the patient or authorized representative who has indicated his/her understanding and acceptance.     Dental Advisory Given  Plan Discussed with: CRNA  Anesthesia Plan Comments:        Anesthesia Quick Evaluation

## 2023-01-14 NOTE — Anesthesia Postprocedure Evaluation (Signed)
Anesthesia Post Note  Patient: Julie Vega  Procedure(s) Performed: COLONOSCOPY WITH PROPOFOL  Patient location during evaluation: PACU Anesthesia Type: General Level of consciousness: awake and alert Pain management: pain level controlled Vital Signs Assessment: post-procedure vital signs reviewed and stable Respiratory status: spontaneous breathing, nonlabored ventilation, respiratory function stable and patient connected to nasal cannula oxygen Cardiovascular status: blood pressure returned to baseline and stable Postop Assessment: no apparent nausea or vomiting Anesthetic complications: no   No notable events documented.   Last Vitals:  Vitals:   01/11/23 1227 01/11/23 1319  BP: 110/77 (!) 93/52  Pulse: 67 81  Resp: 10 18  Temp: 36.6 C 36.6 C  SpO2: 100% 100%    Last Pain:  Vitals:   01/11/23 1319  TempSrc:   PainSc: 0-No pain                 Windell Norfolk

## 2023-01-18 ENCOUNTER — Encounter (HOSPITAL_COMMUNITY): Payer: Self-pay | Admitting: Gastroenterology

## 2023-04-04 DIAGNOSIS — Z6832 Body mass index (BMI) 32.0-32.9, adult: Secondary | ICD-10-CM | POA: Diagnosis not present

## 2023-04-04 DIAGNOSIS — H00012 Hordeolum externum right lower eyelid: Secondary | ICD-10-CM | POA: Diagnosis not present

## 2023-04-18 DIAGNOSIS — H01002 Unspecified blepharitis right lower eyelid: Secondary | ICD-10-CM | POA: Diagnosis not present

## 2023-04-18 DIAGNOSIS — H01004 Unspecified blepharitis left upper eyelid: Secondary | ICD-10-CM | POA: Diagnosis not present

## 2023-04-18 DIAGNOSIS — H0012 Chalazion right lower eyelid: Secondary | ICD-10-CM | POA: Diagnosis not present

## 2023-04-18 DIAGNOSIS — H01001 Unspecified blepharitis right upper eyelid: Secondary | ICD-10-CM | POA: Diagnosis not present

## 2023-05-16 DIAGNOSIS — H01001 Unspecified blepharitis right upper eyelid: Secondary | ICD-10-CM | POA: Diagnosis not present

## 2023-05-16 DIAGNOSIS — H01004 Unspecified blepharitis left upper eyelid: Secondary | ICD-10-CM | POA: Diagnosis not present

## 2023-05-16 DIAGNOSIS — H01002 Unspecified blepharitis right lower eyelid: Secondary | ICD-10-CM | POA: Diagnosis not present

## 2023-05-16 DIAGNOSIS — H0012 Chalazion right lower eyelid: Secondary | ICD-10-CM | POA: Diagnosis not present

## 2023-06-10 DIAGNOSIS — R0781 Pleurodynia: Secondary | ICD-10-CM | POA: Diagnosis not present

## 2023-06-10 DIAGNOSIS — R911 Solitary pulmonary nodule: Secondary | ICD-10-CM | POA: Diagnosis not present

## 2023-06-10 DIAGNOSIS — W109XXA Fall (on) (from) unspecified stairs and steps, initial encounter: Secondary | ICD-10-CM | POA: Diagnosis not present

## 2023-06-10 DIAGNOSIS — Z880 Allergy status to penicillin: Secondary | ICD-10-CM | POA: Diagnosis not present

## 2023-06-10 DIAGNOSIS — R918 Other nonspecific abnormal finding of lung field: Secondary | ICD-10-CM | POA: Diagnosis not present

## 2023-06-10 DIAGNOSIS — S2242XA Multiple fractures of ribs, left side, initial encounter for closed fracture: Secondary | ICD-10-CM | POA: Diagnosis not present

## 2023-06-10 DIAGNOSIS — M549 Dorsalgia, unspecified: Secondary | ICD-10-CM | POA: Diagnosis not present

## 2023-06-10 DIAGNOSIS — J90 Pleural effusion, not elsewhere classified: Secondary | ICD-10-CM | POA: Diagnosis not present

## 2023-06-13 DIAGNOSIS — Z6832 Body mass index (BMI) 32.0-32.9, adult: Secondary | ICD-10-CM | POA: Diagnosis not present

## 2023-06-13 DIAGNOSIS — R911 Solitary pulmonary nodule: Secondary | ICD-10-CM | POA: Diagnosis not present

## 2023-06-13 DIAGNOSIS — S2232XS Fracture of one rib, left side, sequela: Secondary | ICD-10-CM | POA: Diagnosis not present

## 2023-06-13 DIAGNOSIS — S2232XA Fracture of one rib, left side, initial encounter for closed fracture: Secondary | ICD-10-CM | POA: Diagnosis not present

## 2023-06-18 DIAGNOSIS — Z6832 Body mass index (BMI) 32.0-32.9, adult: Secondary | ICD-10-CM | POA: Diagnosis not present

## 2023-06-18 DIAGNOSIS — S2232XA Fracture of one rib, left side, initial encounter for closed fracture: Secondary | ICD-10-CM | POA: Diagnosis not present

## 2023-06-18 DIAGNOSIS — S2232XS Fracture of one rib, left side, sequela: Secondary | ICD-10-CM | POA: Diagnosis not present

## 2023-06-28 DIAGNOSIS — Z6832 Body mass index (BMI) 32.0-32.9, adult: Secondary | ICD-10-CM | POA: Diagnosis not present

## 2023-06-28 DIAGNOSIS — S2232XA Fracture of one rib, left side, initial encounter for closed fracture: Secondary | ICD-10-CM | POA: Diagnosis not present

## 2023-06-28 DIAGNOSIS — S2232XS Fracture of one rib, left side, sequela: Secondary | ICD-10-CM | POA: Diagnosis not present

## 2023-07-30 DIAGNOSIS — S2232XS Fracture of one rib, left side, sequela: Secondary | ICD-10-CM | POA: Diagnosis not present

## 2023-07-30 DIAGNOSIS — S2232XA Fracture of one rib, left side, initial encounter for closed fracture: Secondary | ICD-10-CM | POA: Diagnosis not present

## 2023-07-30 DIAGNOSIS — Z6832 Body mass index (BMI) 32.0-32.9, adult: Secondary | ICD-10-CM | POA: Diagnosis not present

## 2023-10-05 IMAGING — MG MM DIGITAL SCREENING BILAT W/ TOMO AND CAD
8 series · 8 of 24 positions shown · non-contrast
Comparison: Previous exam(s).

CLINICAL DATA: Screening.

EXAM:
DIGITAL SCREENING BILATERAL MAMMOGRAM WITH TOMOSYNTHESIS AND CAD
TECHNIQUE: Bilateral screening digital craniocaudal and mediolateral oblique
mammograms were obtained. Bilateral screening digital breast
tomosynthesis was performed. The images were evaluated with
computer-aided detection.

[L MLO synth-2D]
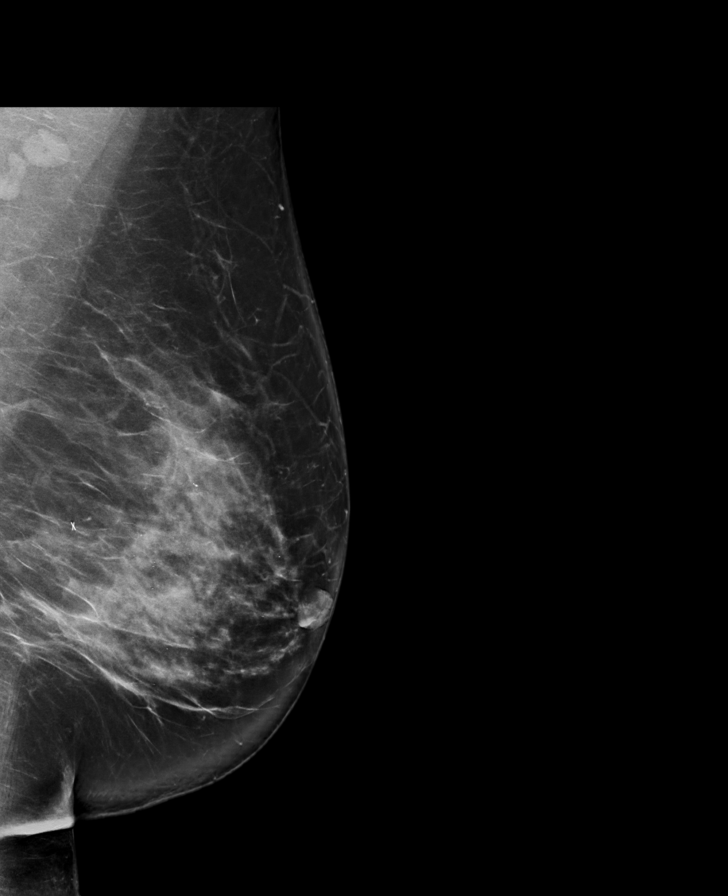

[L CC synth-2D]
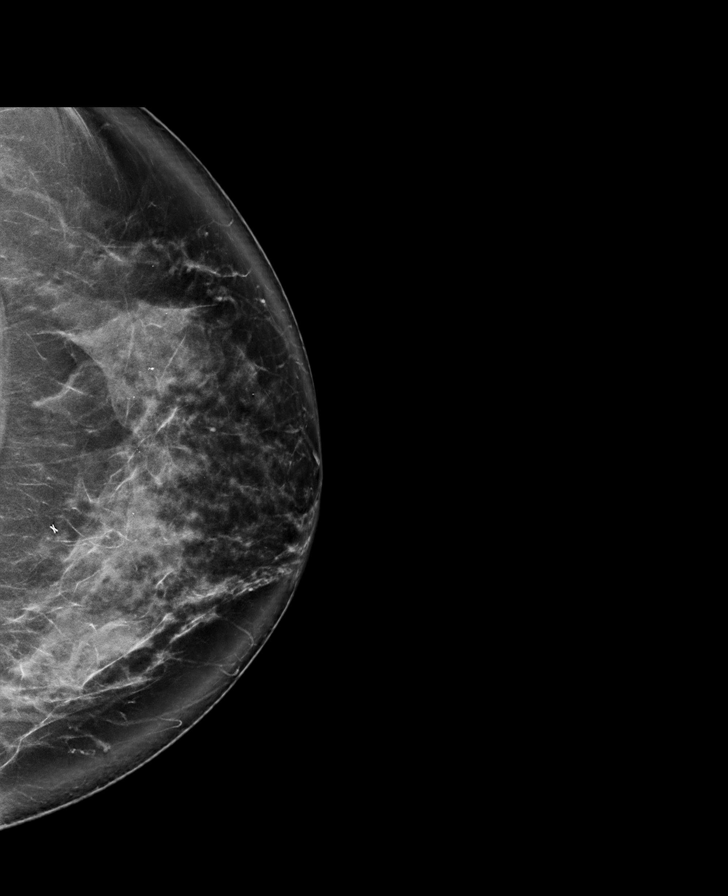

[R MLO synth-2D]
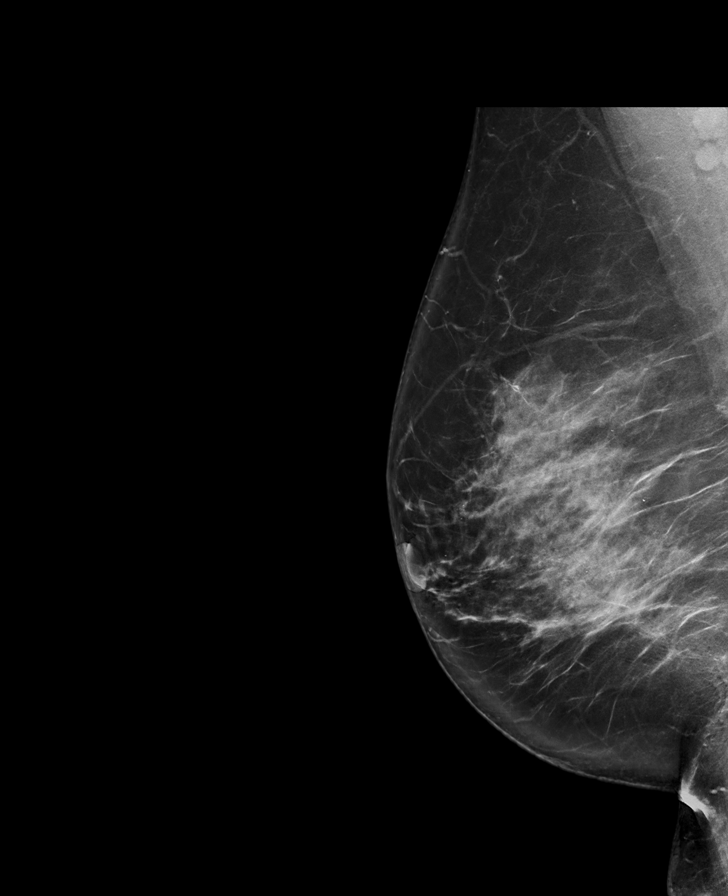

[R CC synth-2D]
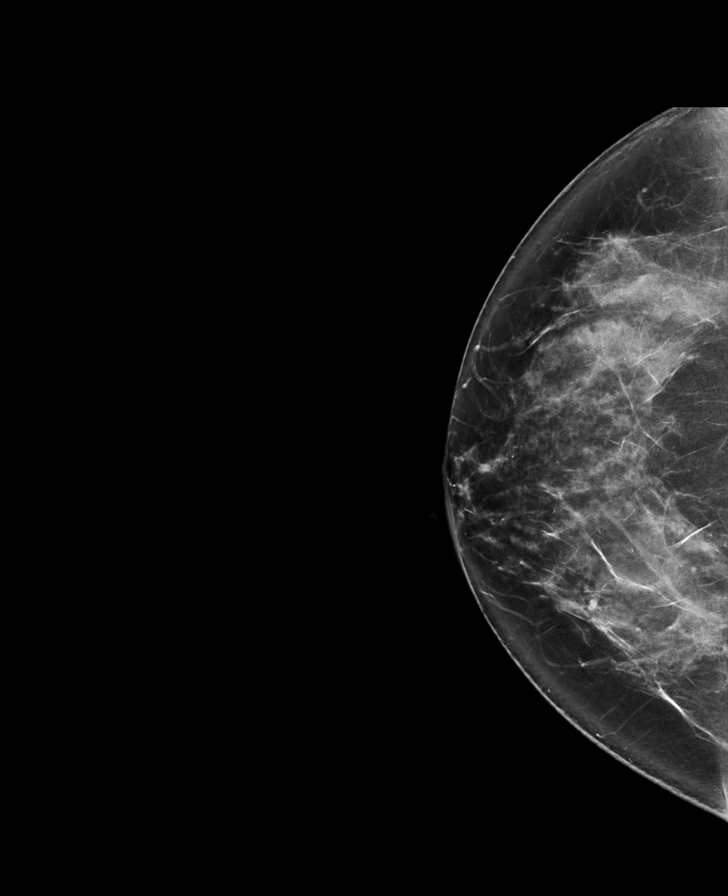

[L CC tomo · tomo slice 49/98.0]
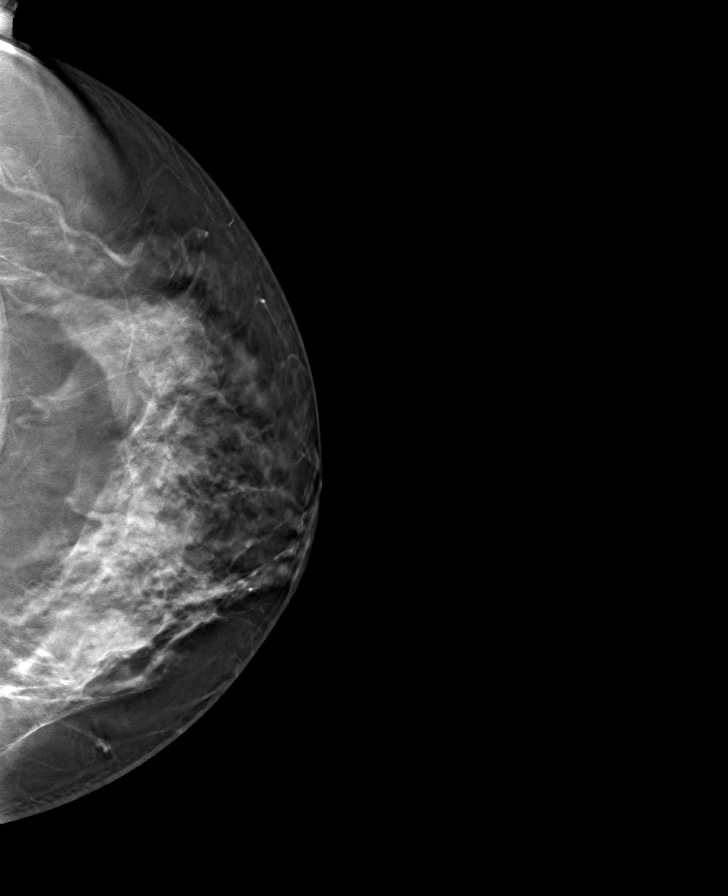

[R MLO tomo · tomo slice 51/100.0]
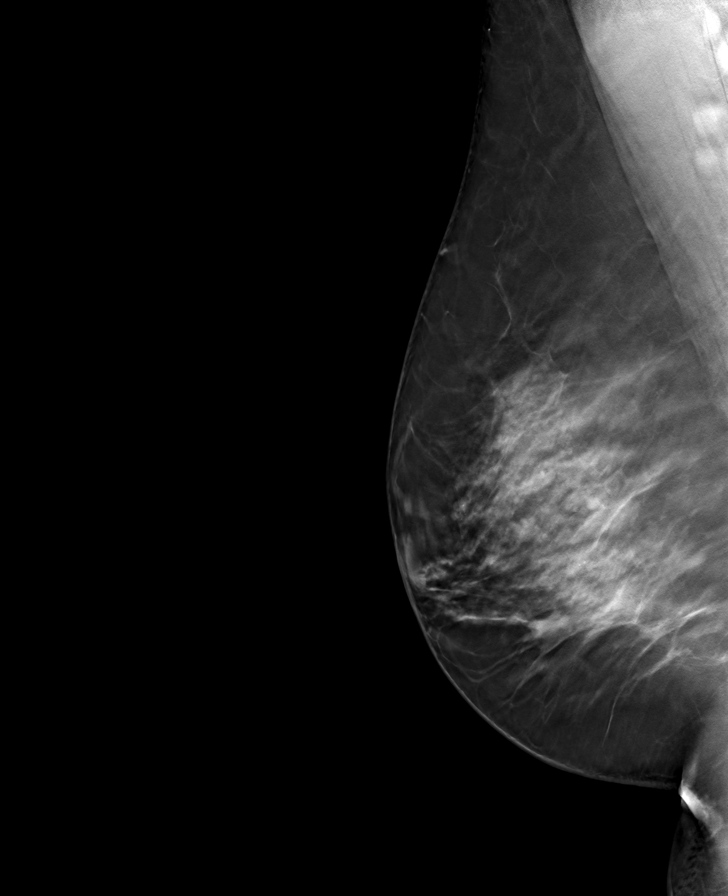

[R CC tomo · tomo slice 44/87.0]
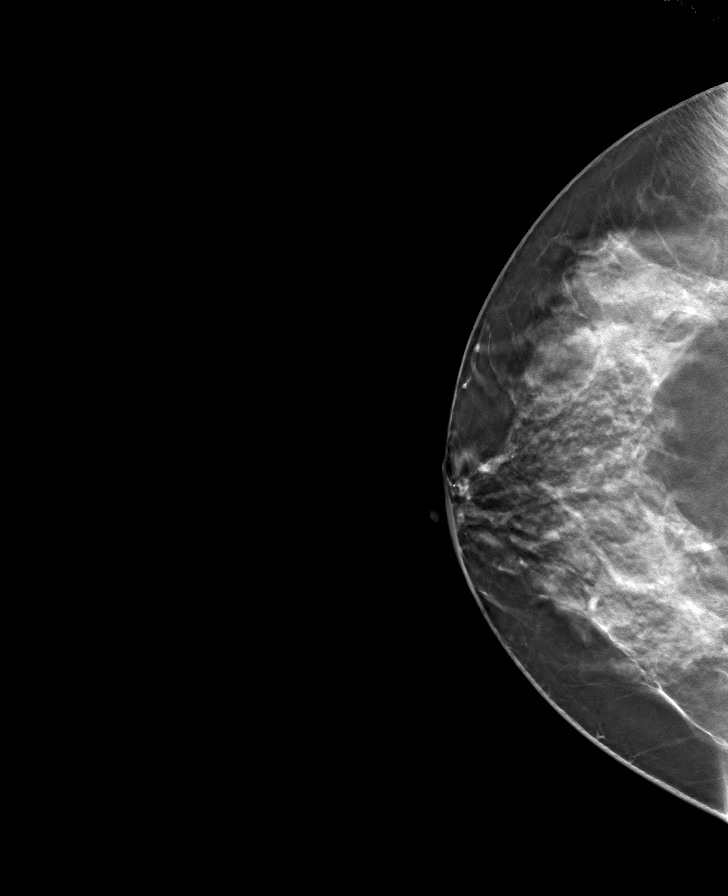

[L MLO tomo · tomo slice 54/107.0]
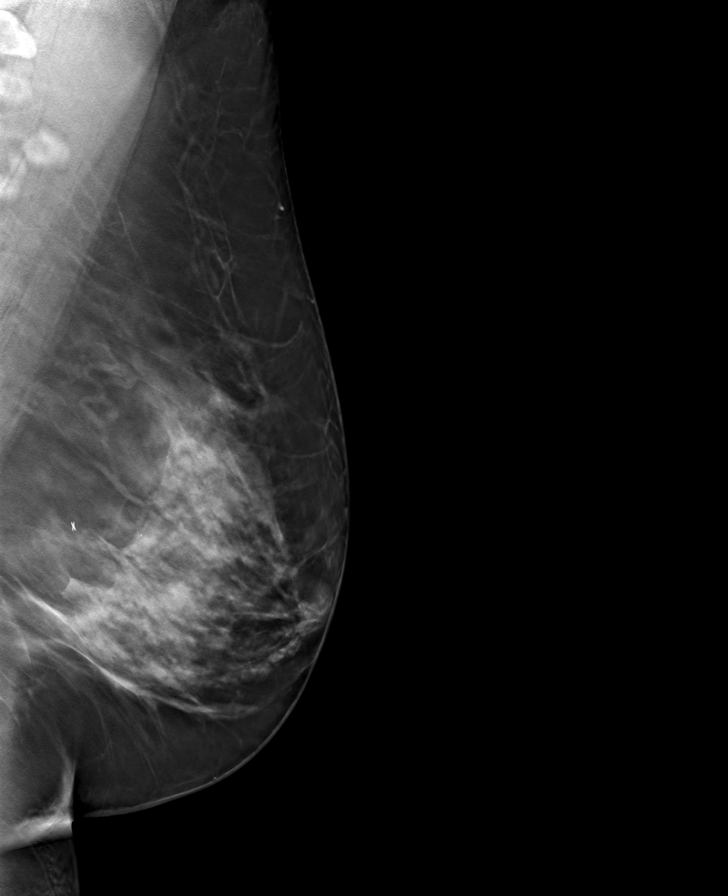

[8 of 24 positions shown; findings below may reference images not displayed]

ACR Breast Density Category c: The breast tissue is heterogeneously
dense, which may obscure small masses.
FINDINGS: There are no findings suspicious for malignancy.
IMPRESSION: No mammographic evidence of malignancy. A result letter of this
screening mammogram will be mailed directly to the patient.

RECOMMENDATION:
Screening mammogram in one year. (Code:Q3-W-BC3)

BI-RADS CATEGORY  1: Negative.

## 2023-11-27 DIAGNOSIS — Z6834 Body mass index (BMI) 34.0-34.9, adult: Secondary | ICD-10-CM | POA: Diagnosis not present

## 2023-11-27 DIAGNOSIS — G9009 Other idiopathic peripheral autonomic neuropathy: Secondary | ICD-10-CM | POA: Diagnosis not present

## 2023-12-07 DIAGNOSIS — R92333 Mammographic heterogeneous density, bilateral breasts: Secondary | ICD-10-CM | POA: Diagnosis not present

## 2023-12-07 DIAGNOSIS — Z1231 Encounter for screening mammogram for malignant neoplasm of breast: Secondary | ICD-10-CM | POA: Diagnosis not present

## 2023-12-07 DIAGNOSIS — R911 Solitary pulmonary nodule: Secondary | ICD-10-CM | POA: Diagnosis not present

## 2023-12-26 ENCOUNTER — Ambulatory Visit: Admitting: Orthopedic Surgery

## 2023-12-26 ENCOUNTER — Encounter: Payer: Self-pay | Admitting: Orthopedic Surgery

## 2023-12-26 ENCOUNTER — Other Ambulatory Visit: Payer: Self-pay

## 2023-12-26 VITALS — BP 138/83 | HR 78 | Ht 71.0 in | Wt 240.0 lb

## 2023-12-26 DIAGNOSIS — R202 Paresthesia of skin: Secondary | ICD-10-CM

## 2023-12-26 DIAGNOSIS — M542 Cervicalgia: Secondary | ICD-10-CM | POA: Diagnosis not present

## 2023-12-26 NOTE — Patient Instructions (Addendum)
 We are referring you to [facility] from [facility] address is [address] [place] The phone number is [phone]  The office will call you with an appointment Dr. Eldonna will do the nerve study    Cervical Strain and Sprain Rehab You have pain and stiffness in your neck.  The muscles around your neck are irritated.  Recommend using heat (heating pad, or hot water in the shower) to warm up the affected muscles.  Then proceed with stretching and strengthening.  Do not stretch until it hurts, but you should feel a pull.  With each exercise, you should be able to stretch a little bit further.  Attempting these exercises daily, or on a regular basis, can improve your symptoms over time.  Do not expect immediate, sustained improvement.  But, it will make your symptoms better over time.   Ask your health care provider which exercises are safe for you. Do exercises exactly as told by your health care provider and adjust them as directed. It is normal to feel mild stretching, pulling, tightness, or discomfort as you do these exercises. Stop right away if you feel sudden pain or your pain gets worse. Do not begin these exercises until told by your health care provider. Stretching and range-of-motion exercises Cervical side bending  Using good posture, sit on a stable chair or stand up. Without moving your shoulders, slowly tilt your left / right ear to your shoulder until you feel a stretch in the opposite side neck muscles. You should be looking straight ahead. Hold for 10 seconds. Repeat with the other side of your neck. Repeat 10 times. Complete this exercise 1-2 times a day. Cervical rotation  Using good posture, sit on a stable chair or stand up. Slowly turn your head to the side as if you are looking over your left / right shoulder. Keep your eyes level with the ground. Stop when you feel a stretch along the side and the back of your neck. Hold for 10  seconds. Repeat this by turning to your other side. Repeat 10 times. Complete this exercise 1-2 times a day. Thoracic extension and pectoral stretch Roll a towel or a small blanket so it is about 4 inches (10 cm) in diameter. Lie down on your back on a firm surface. Put the towel lengthwise, under your spine in the middle of your back. It should not be under your shoulder blades. The towel should line up with your spine from your middle back to your lower back. Put your hands behind your head and let your elbows fall out to your sides. Hold for 10 seconds. Repeat 10 times. Complete this exercise 1-2 times a day. Strengthening exercises Isometric upper cervical flexion Lie on your back with a thin pillow behind your head and a small rolled-up towel under your neck. Gently tuck your chin toward your chest and nod your head down to look toward your feet. Do not lift your head off the pillow. Hold for 10 seconds. Release the tension slowly. Relax your neck muscles completely before you repeat this exercise. Repeat 10 times. Complete this exercise 1-2 times a day. Isometric cervical extension  Stand about 6 inches (15 cm) away from a wall, with your back facing the wall. Place a soft object, about 6-8 inches (15-20 cm) in diameter, between the back of your head and the wall. A soft object could be a small pillow, a ball, or a folded towel. Gently tilt your  head back and press into the soft object. Keep your jaw and forehead relaxed. Hold for 10 seconds. Release the tension slowly. Relax your neck muscles completely before you repeat this exercise. Repeat 10 times. Complete this exercise 1-2 times a day. Posture and body mechanics Body mechanics refers to the movements and positions of your body while you do your daily activities. Posture is part of body mechanics. Good posture and healthy body mechanics can help to relieve stress in your body's tissues and joints. Good posture means that your  spine is in its natural S-curve position (your spine is neutral), your shoulders are pulled back slightly, and your head is not tipped forward. The following are general guidelines for applying improved posture and body mechanics to your everyday activities. Sitting  When sitting, keep your spine neutral and keep your feet flat on the floor. Use a footrest, if necessary, and keep your thighs parallel to the floor. Avoid rounding your shoulders, and avoid tilting your head forward. When working at a desk or a computer, keep your desk at a height where your hands are slightly lower than your elbows. Slide your chair under your desk so you are close enough to maintain good posture. When working at a computer, place your monitor at a height where you are looking straight ahead and you do not have to tilt your head forward or downward to look at the screen. Standing  When standing, keep your spine neutral and keep your feet about hip-width apart. Keep a slight bend in your knees. Your ears, shoulders, and hips should line up. When you do a task in which you stand in one place for a long time, place one foot up on a stable object that is 2-4 inches (5-10 cm) high, such as a footstool. This helps keep your spine neutral. Resting When lying down and resting, avoid positions that are most painful for you. Try to support your neck in a neutral position. You can use a contour pillow or a small rolled-up towel. Your pillow should support your neck but not push on it. This information is not intended to replace advice given to you by your health care provider. Make sure you discuss any questions you have with your health care provider. Document Revised: 05/08/2018 Document Reviewed: 10/17/2017 Elsevier Patient Education  2022 [employer].
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
## 2023-12-31 DIAGNOSIS — M25532 Pain in left wrist: Secondary | ICD-10-CM | POA: Diagnosis not present

## 2023-12-31 DIAGNOSIS — Z6834 Body mass index (BMI) 34.0-34.9, adult: Secondary | ICD-10-CM | POA: Diagnosis not present

## 2023-12-31 DIAGNOSIS — Z0000 Encounter for general adult medical examination without abnormal findings: Secondary | ICD-10-CM | POA: Diagnosis not present

## 2023-12-31 DIAGNOSIS — G9009 Other idiopathic peripheral autonomic neuropathy: Secondary | ICD-10-CM | POA: Diagnosis not present

## 2023-12-31 DIAGNOSIS — M25531 Pain in right wrist: Secondary | ICD-10-CM | POA: Diagnosis not present

## 2024-01-11 ENCOUNTER — Telehealth: Payer: Self-pay | Admitting: Physical Medicine and Rehabilitation

## 2024-01-11 NOTE — Telephone Encounter (Signed)
 Pt called and said her hand is very bad off. She stated that her hand is tingling, throbbing and more. She is in very bad pain. CB#[phone]

## 2024-01-11 NOTE — Telephone Encounter (Signed)
 Pt called and wanted to know if you have something sooner. CB#[phone]

## 2024-01-14 NOTE — Telephone Encounter (Signed)
 Spoke with patient and advised we would put her on the wait list

## 2024-01-22 ENCOUNTER — Ambulatory Visit: Admitting: Physical Medicine and Rehabilitation

## 2024-01-22 DIAGNOSIS — M79641 Pain in right hand: Secondary | ICD-10-CM | POA: Diagnosis not present

## 2024-01-22 DIAGNOSIS — R29898 Other symptoms and signs involving the musculoskeletal system: Secondary | ICD-10-CM | POA: Diagnosis not present

## 2024-01-22 DIAGNOSIS — M79642 Pain in left hand: Secondary | ICD-10-CM | POA: Diagnosis not present

## 2024-01-22 DIAGNOSIS — R202 Paresthesia of skin: Secondary | ICD-10-CM | POA: Diagnosis not present

## 2024-01-22 NOTE — Progress Notes (Signed)
 Pain Scale   Average Pain 0 Patient advising she has Numbness, tingling and weakness in both hand. Patient is Right Hand dominate.        +Driver, -BT, -Dye Allergies.

## 2024-01-23 NOTE — Procedures (Signed)
 EMG & NCV Findings: Evaluation of the left median motor and the right median motor nerves showed prolonged distal onset latency (L4.7, R4.5 ms) and decreased conduction velocity (Elbow-Wrist, L49, R49 m/s).  The left median (across palm) sensory nerve showed no response (Palm), prolonged distal peak latency (6.0 ms), and reduced amplitude (5.7 V).  The right median (across palm) sensory nerve showed prolonged distal peak latency (Wrist, 5.1 ms) and reduced amplitude (5.8 V).  All remaining nerves (as indicated in the following tables) were within normal limits.  All left vs. right side differences were within normal limits.  **Left ulnar motor nerve was limited and testing do to technical error with the equipment.  All examined muscles (as indicated in the following table) showed no evidence of electrical instability.    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   There is no significant electrodiagnostic evidence of any other focal nerve entrapment, brachial plexopathy or cervical radiculopathy. ** As you know, this particular electrodiagnostic study cannot rule out chemical radiculitis or sensory only radiculopathy.  Recommendations: 1.  Follow-up with referring physician.  2.  Continue current management of symptoms. 3.  Suggest use of resting splint at night-time and as needed during the day. 4.  Suggest surgical evaluation.  If felt to be radicular at all suggest cervical MRI.  ___________________________ Prentice Masters [facility] Board Certified, American Board of Physical Medicine and Rehabilitation    Nerve Conduction Studies Anti Sensory Summary Table   Stim Site NR Peak (ms) Norm Peak (ms) P-T Amp (V) Norm P-T Amp Site1 Site2 Delta-P (ms) Dist (cm) Vel (m/s) Norm Vel (m/s)  Left Median Acr Palm Anti Sensory  (2nd Digit)  30C  Wrist    *6.0 <3.6 *5.7 >10 Wrist Palm  0.0    Palm *NR  <2.0          Right Median Acr Palm Anti Sensory (2nd Digit)  29.4C  Wrist    *5.1 <3.6 *5.8 >10 Wrist Palm 3.3 0.0    Palm    1.8 <2.0 13.3         Left Radial Anti Sensory (Base 1st Digit)  30.2C  Wrist    2.8 <3.1 1.5  Wrist Base 1st Digit 2.8 0.0    Right Radial Anti Sensory (Base 1st Digit)  29.5C  Wrist    2.2 <3.1 17.8  Wrist Base 1st Digit 2.2 0.0    Left Ulnar Anti Sensory (5th Digit)  31C  B Elbow    3.6  27.3         Right Ulnar Anti Sensory (5th Digit)  30.3C  Wrist    3.7 <3.7 24.5 >15.0 Wrist 5th Digit 3.7 14.0 38 >38   Motor Summary Table   Stim Site NR Onset (ms) Norm Onset (ms) O-P Amp (mV) Norm O-P Amp Site1 Site2 Delta-0 (ms) Dist (cm) Vel (m/s) Norm Vel (m/s)  Left Median Motor (Abd Poll Brev)  30.8C  Wrist    *4.7 <4.2 7.5 >5 Elbow Wrist 4.7 23.0 *49 >50  Elbow    9.4  5.7         Right Median Motor (Abd Poll Brev)  30.3C  Wrist    *4.5 <4.2 9.3 >5 Elbow Wrist 4.5 22.0 *49 >50  Elbow    9.0  6.2         Left  Ulnar Motor (Abd Dig Min)  29.9C  Wrist    2.9 <4.2 10.2 >3 B Elbow Wrist 5.5 0.0  >53  B Elbow    8.4  7.4  A Elbow B Elbow 5.4 0.0  >53  A Elbow    13.8  0.0         Right Ulnar Motor (Abd Dig Min)  30.4C  Wrist    2.9 <4.2 11.5 >3 B Elbow Wrist 4.0 21.0 53 >53  B Elbow    6.9  8.6  A Elbow B Elbow 1.8 11.0 61 >53  A Elbow    8.7  7.1          EMG   Side Muscle Nerve Root Ins Act Fibs Psw Amp Dur Poly Recrt Int Bruna Comment  Left Abd Poll Brev Median C8-T1 Nml Nml Nml Nml Nml 0 Nml Nml   Left 1stDorInt Ulnar C8-T1 Nml Nml Nml Nml Nml 0 Nml Nml   Left PronatorTeres Median C6-7 Nml Nml Nml Nml Nml 0 Nml Nml   Left Biceps Musculocut C5-6 Nml Nml Nml Nml Nml 0 Nml Nml   Left Deltoid Axillary C5-6 Nml Nml Nml Nml Nml 0 Nml Nml     Nerve Conduction Studies Anti Sensory Left/Right Comparison   Stim Site L Lat (ms) R Lat (ms) L-R Lat (ms) L Amp (V) R Amp (V) L-R Amp  (%) Site1 Site2 L Vel (m/s) R Vel (m/s) L-R Vel (m/s)  Median Acr Palm Anti Sensory (2nd Digit)  30C  Wrist *6.0 *5.1 0.9 *5.7 *5.8 1.7 Wrist Palm     Palm  1.8   13.3        Radial Anti Sensory (Base 1st Digit)  30.2C  Wrist 2.8 2.2 0.6 1.5 17.8 91.6 Wrist Base 1st Digit     Ulnar Anti Sensory (5th Digit)  31C  B Elbow 3.6   27.3          Motor Left/Right Comparison   Stim Site L Lat (ms) R Lat (ms) L-R Lat (ms) L Amp (mV) R Amp (mV) L-R Amp (%) Site1 Site2 L Vel (m/s) R Vel (m/s) L-R Vel (m/s)  Median Motor (Abd Poll Brev)  30.8C  Wrist *4.7 *4.5 0.2 7.5 9.3 19.4 Elbow Wrist *49 *49 0  Elbow 9.4 9.0 0.4 5.7 6.2 8.1       Ulnar Motor (Abd Dig Min)  29.9C  Wrist 2.9 2.9 0.0 10.2 11.5 11.3 B Elbow Wrist  53   B Elbow 8.4 6.9 1.5 7.4 8.6 14.0 A Elbow B Elbow  61   A Elbow 13.8 8.7 5.1 0.0 7.1 100.0          Waveforms:

## 2024-01-28 ENCOUNTER — Encounter: Payer: Self-pay | Admitting: Physical Medicine and Rehabilitation
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
## 2024-01-30 ENCOUNTER — Ambulatory Visit (INDEPENDENT_AMBULATORY_CARE_PROVIDER_SITE_OTHER): Admitting: Orthopedic Surgery

## 2024-01-30 ENCOUNTER — Encounter: Payer: Self-pay | Admitting: Orthopedic Surgery

## 2024-01-30 DIAGNOSIS — G5601 Carpal tunnel syndrome, right upper limb: Secondary | ICD-10-CM

## 2024-01-30 DIAGNOSIS — G5602 Carpal tunnel syndrome, left upper limb: Secondary | ICD-10-CM

## 2024-01-30 DIAGNOSIS — G5603 Carpal tunnel syndrome, bilateral upper limbs: Secondary | ICD-10-CM | POA: Diagnosis not present

## 2024-01-30 NOTE — Patient Instructions (Signed)
 Preoperative Instructions  Your surgery will be at [facility], scheduled with Dr Oneil Horde   The hospital will contact you with a preoperative appointment to discuss Anesthesia. The phone number is [phone]   Please bring your medications with you for the appointment.  They will tell you the arrival time and medication instructions when you have your preoperative evaluation.  Do not wear nail polish the day of your surgery and if you take Phentermine you need to stop this medication ONE WEEK prior to your surgery.    If you take an blood thinning medication, we will need to stop this prior to surgery.  Typically, we stop this medicine at least 5 days prior to surgery.  We will need to confirm this with the doctor who prescribes this medication.  If you are taking medications or an injection for diabetes, or for weight management, this medicine will need to be stopped at least 7 days prior to surgery.     Surgery will be scheduled for 02/11/24 pending authorization by your insurance company.   Pain medicine policy:  Per [facility] clinic policy, our goal is ensure optimal postoperative pain control with a multimodal pain management strategy.   For all [facility] patients, our goal is to wean post-operative narcotic medications by 6 weeks post-operatively.   If this is not possible due to utilization of pain medication prior to surgery, your [facility] doctor will support your acute post-operative pain control for the first 6 weeks postoperatively, with a plan to transition you back to your primary pain team following that.   [facility] will work to ensure a therapist, occupational.

## 2024-01-30 NOTE — Progress Notes (Signed)
 Return patient Visit  Assessment: Julie Vega is a 53 y.o. female with the following: 1. Neck pain 2. Paresthesia of both hands  Plan: [facility] has completed bilateral EMGs, which note that she has bilateral carpal tunnel syndrome.  Per review of the notes, it is less likely that her symptoms are caused by cervical radiculopathy.  As result, discussed proceeding with carpal tunnel release.  Left is worse than right.  She would like to proceed with her left wrist prior to her right wrist.  Procedure was discussed in detail.  She would like to proceed with surgery in February 11, 2024.  Risks and benefits of the surgery, including, but not limited to infection, bleeding, persistent pain, need for further surgery, recurrence of symptoms, incomplete resolution of symptoms and more severe complications associated with anesthesia were discussed with the patient.  The patient has elected to proceed.    Follow-up: Return for After surgery; DOS 02/11/24.  Subjective:  Chief Complaint  Patient presents with   Results    Review nerve study    History of Present Illness: Julie Vega is a 53 y.o. female who returns to clinic for repeat evaluation of bilateral hand numbness and tingling.  She has been evaluated by Dr. Eldonna.  EMGs are complete.  She continues to have pain, burning symptoms, numbness and tingling in bilateral hands.  Left is slightly worse than right.  She is here to discuss the results of the EMG.   Review of Systems: No fevers or chills + numbness or tingling No chest pain No shortness of breath No bowel or bladder dysfunction No GI distress No headaches      Objective: LMP 11/09/2011   Physical Exam:  General: Alert and oriented. and No acute distress. Gait: Normal gait.   Bilateral hands without deformity.  Fingers warm well-perfused.  Decreased sensation to the index and long fingers bilaterally.  Left is worse than right.  Positive  Tinel's.  Stiffness in the wrists, positive Phalen's.  IMAGING: I personally ordered and reviewed the following images  BUE EMGs  Moderate to severe bilateral median nerve compression, left slightly worse than right.   New Medications:  No orders of the defined types were placed in this encounter.     Oneil DELENA Horde, MD  01/30/2024 1:45 PM

## 2024-01-30 NOTE — Addendum Note (Signed)
 Addended by: ONESIMO ANES A on: 01/30/2024 03:02 PM   Modules accepted: Orders

## 2024-01-30 NOTE — H&P (View-Only) (Signed)
 Return patient Visit  Assessment: Julie Vega is a 53 y.o. female with the following: 1. Neck pain 2. Paresthesia of both hands  Plan: [facility] has completed bilateral EMGs, which note that she has bilateral carpal tunnel syndrome.  Per review of the notes, it is less likely that her symptoms are caused by cervical radiculopathy.  As result, discussed proceeding with carpal tunnel release.  Left is worse than right.  She would like to proceed with her left wrist prior to her right wrist.  Procedure was discussed in detail.  She would like to proceed with surgery in February 11, 2024.  Risks and benefits of the surgery, including, but not limited to infection, bleeding, persistent pain, need for further surgery, recurrence of symptoms, incomplete resolution of symptoms and more severe complications associated with anesthesia were discussed with the patient.  The patient has elected to proceed.    Follow-up: Return for After surgery; DOS 02/11/24.  Subjective:  Chief Complaint  Patient presents with   Results    Review nerve study    History of Present Illness: Julie Vega is a 53 y.o. female who returns to clinic for repeat evaluation of bilateral hand numbness and tingling.  She has been evaluated by Dr. Eldonna.  EMGs are complete.  She continues to have pain, burning symptoms, numbness and tingling in bilateral hands.  Left is slightly worse than right.  She is here to discuss the results of the EMG.   Review of Systems: No fevers or chills + numbness or tingling No chest pain No shortness of breath No bowel or bladder dysfunction No GI distress No headaches      Objective: LMP 11/09/2011   Physical Exam:  General: Alert and oriented. and No acute distress. Gait: Normal gait.   Bilateral hands without deformity.  Fingers warm well-perfused.  Decreased sensation to the index and long fingers bilaterally.  Left is worse than right.  Positive  Tinel's.  Stiffness in the wrists, positive Phalen's.  IMAGING: I personally ordered and reviewed the following images  BUE EMGs  Moderate to severe bilateral median nerve compression, left slightly worse than right.   New Medications:  No orders of the defined types were placed in this encounter.     Oneil DELENA Horde, MD  01/30/2024 1:45 PM

## 2024-02-01 NOTE — Addendum Note (Signed)
 Addended by: VICENTA EMMIE HERO on: 02/01/2024 08:28 AM   Modules accepted: Orders

## 2024-02-04 ENCOUNTER — Telehealth: Payer: Self-pay | Admitting: Orthopedic Surgery

## 2024-02-04 NOTE — Telephone Encounter (Signed)
 Dr. Onesimo pt - pt lvm on 02/01/24 at 1:34pm, stated she's supposed to have surgery on 02/11/24 and wants to know if she decides to do both hands, what is the recovery time for one and recovery time for both.  [phone]

## 2024-02-05 NOTE — Patient Instructions (Addendum)
 "      Julie Vega  02/05/2024     @PREFPERIOPPHARMACY @   Your procedure is scheduled on  02/11/2024.   [facility] to [facility] at  [phone] A.M.   Call this number if you have problems the morning of surgery:  [phone]  If you experience any cold or flu symptoms such as cough, fever, chills, shortness of breath, etc. between now and your scheduled surgery, please notify us  at the above number.   Remember:  Do not eat  after midnight.   You may drink clear liquids until  0400 am on 02/11/2024.       Clear liquids allowed are:                    Water, Carbonated beverages (diabetics please choose diet or no sugar options), Black Coffee Only (No creamer, milk or cream, including half & half and powdered creamer), and Clear Sports drink (No red color; diabetics please choose diet or no sugar options)           At 0400 am on 02/11/2024 drink your carb drink. You can have nothing else to drink after this.   Take these medicines the morning of surgery with A SIP OF WATER                                                          lyrica.    Do not wear jewelry, make-up or nail polish, including gel polish,  artificial nails, or any other type of covering on natural nails (fingers and  toes).  Do not wear lotions, powders, or perfumes, or deodorant.  Do not shave 48 hours prior to surgery.  Men may shave face and neck.  Do not bring valuables to the hospital.  [facility] is not responsible for any belongings or valuables.  Contacts, dentures or bridgework may not be worn into surgery.  Leave your suitcase in the car.  After surgery it may be brought to your room.  For patients admitted to the hospital, discharge time will be determined by your treatment team.  Patients discharged the day of surgery will not be allowed to drive home and must have someone with them for 24 hours.    Special instructions:   DO NOT smoke tobacco or vape for 24 hours before your procedure.  Please  read over the following fact sheets that you were given. Pain Booklet, Coughing and Deep Breathing, Surgical Site Infection Prevention, Anesthesia Post-op Instructions, and Care and Recovery After Surgery         Open Carpal Tunnel Release: What to Know After After open carpal tunnel release surgery, it's common to have pain, swelling, bruising, and stiffness in the wrist. Follow these instructions at home: Medicines Take your medicines only as told. You may need to take steps to help treat or prevent trouble pooping (constipation), such as: Taking medicines to help you poop. Eating foods high in fiber, like beans, whole grains, and fresh fruits and vegetables. Drinking more fluids as told. If you have a splint or brace that can be taken off: Wear the splint or brace as told. Take it off only if your health care provider says you can. Check the skin around it every day. Tell your provider if you see problems. Loosen  the splint or brace if your fingers tingle, are numb, or turn cold and blue. Keep the splint or brace clean. If the splint or brace isn't waterproof: Do not let it get wet. Cover it when you take a bath or shower. Use a cover that doesn't let any water in. Bathing Do not take baths, swim, or use a hot tub until you're told it's OK. Ask if you can shower. Keep your bandage dry until your provider says it can be removed. Cover it when you take a bath or shower. Use a cover that doesn't let any water in. Wound care  Take care of your cut from surgery as told. Make sure you: Wash your hands with soap and water for at least 20 seconds before and after you change your bandage. If you can't use soap and water, use hand sanitizer. Change your bandage. Leave stitches or skin glue alone. Leave tape strips alone unless you're told to take them off. You may trim the edges of the tape strips if they curl up. Check the area around your cut from surgery every day for signs of  infection. Check for: Redness, swelling, or pain. Fluid or blood. Warmth. Pus or a bad smell. Pain Management  Use ice or an ice pack as told. If you have a splint or brace that you can take off, remove it only as told. Place a towel between your skin and the ice. Leave the ice on for 20 minutes, 2-3 times a day. If your skin turns red, take off the ice right away to prevent skin damage. The risk of damage is higher if you can't feel pain, heat, or cold. Move your fingers often to avoid stiffness and swelling. Raise your wrist above the level of your heart while you're sitting or lying down. Use pillows as needed. Activity Ask when it's safe to drive if you have a splint or brace on your hand. Do not lift with your affected hand until you're told it's OK. Avoid pulling and pushing with the injured arm. Do exercises as told. Ask what things are safe for you to do at home. Ask when you can go back to work or school. General instructions Do not smoke, vape or use nicotine or tobacco. Doing this can slow down healing. Keep all follow-up visits. You'll need to have your hand checked after surgery. Your provider may give you more instructions. Make sure you know what you can and can't do. Contact a health care provider if: You have signs of infection. You have a fever. You have pain that doesn't get better with medicine. Your carpal tunnel symptoms don't go away after 2 months. Your carpal tunnel symptoms go away and then come back. You have numbness that's getting worse. Get help right away if: Your fingers or hand turn cool or pale, or they change color. They may turn blue or grey. You aren't able to move your fingers. You lose feeling in your fingers, hand, or arm. These symptoms may be an emergency. Call 911 right away. Do not wait to see if the symptoms will go away. Do not drive yourself to the hospital. This information is not intended to replace advice given to you by your  health care provider. Make sure you discuss any questions you have with your health care provider. Document Revised: 10/16/2022 Document Reviewed: 10/16/2022 Elsevier Patient Education  2024 [employer]General Anesthesia, Adult, Care After The following information offers guidance on how to care for yourself after  your procedure. Your health care provider may also give you more specific instructions. If you have problems or questions, contact your health care provider. What can I expect after the procedure? After the procedure, it is common for people to: Have pain or discomfort at the IV site. Have nausea or vomiting. Have a sore throat or hoarseness. Have trouble concentrating. Feel cold or chills. Feel weak, sleepy, or tired (fatigue). Have soreness and body aches. These can affect parts of the body that were not involved in surgery. Follow these instructions at home: For the time period you were told by your health care provider:  Rest. Do not participate in activities where you could fall or become injured. Do not drive or use machinery. Do not drink alcohol. Do not take sleeping pills or medicines that cause drowsiness. Do not make important decisions or sign legal documents. Do not take care of children on your own. General instructions Drink enough fluid to keep your urine pale yellow. If you have sleep apnea, surgery and certain medicines can increase your risk for breathing problems. Follow instructions from your health care provider about wearing your sleep device: Anytime you are sleeping, including during daytime naps. While taking prescription pain medicines, sleeping medicines, or medicines that make you drowsy. Return to your normal activities as told by your health care provider. Ask your health care provider what activities are safe for you. Take over-the-counter and prescription medicines only as told by your health care provider. Do not use any products that  contain nicotine or tobacco. These products include cigarettes, chewing tobacco, and vaping devices, such as e-cigarettes. These can delay incision healing after surgery. If you need help quitting, ask your health care provider. Contact a health care provider if: You have nausea or vomiting that does not get better with medicine. You vomit every time you eat or drink. You have pain that does not get better with medicine. You cannot urinate or have bloody urine. You develop a skin rash. You have a fever. Get help right away if: You have trouble breathing. You have chest pain. You vomit blood. These symptoms may be an emergency. Get help right away. Call 911. Do not wait to see if the symptoms will go away. Do not drive yourself to the hospital. Summary After the procedure, it is common to have a sore throat, hoarseness, nausea, vomiting, or to feel weak, sleepy, or fatigue. For the time period you were told by your health care provider, do not drive or use machinery. Get help right away if you have difficulty breathing, have chest pain, or vomit blood. These symptoms may be an emergency. This information is not intended to replace advice given to you by your health care provider. Make sure you discuss any questions you have with your health care provider. Document Revised: 04/15/2021 Document Reviewed: 04/15/2021 Elsevier Patient Education  2024 [employer].How to Use Chlorhexidine at Home in the Shower Chlorhexidine gluconate (CHG) is a germ-killing (antiseptic) wash that's used to clean the skin. It can get rid of the germs that normally live on the skin and can keep them away for about 24 hours. If you're having surgery, you may be told to shower with CHG at home the night before surgery. This can help lower your risk for infection. To use CHG wash in the shower, follow the steps below. Supplies needed: CHG body wash. Clean washcloth. Clean towel. How to use CHG in the shower Follow  these steps unless you're told to  use CHG in a different way: Start the shower. Use your normal soap and shampoo to wash your face and hair. Turn off the shower or move out of the shower stream. Pour CHG onto a clean washcloth. Do not use any type of brush or rough sponge. Start at your neck, washing your body down to your toes. Make sure you: Wash the part of your body where the surgery will be done for at least 1 minute. Do not scrub. Do not use CHG on your head or face unless your health care provider tells you to. If it gets into your ears or eyes, rinse them well with water. Do not wash your genitals with CHG. Wash your back and under your arms. Make sure to wash skin folds. Let the CHG sit on your skin for 1-2 minutes or as long as told. Rinse your entire body in the shower, including all body creases and folds. Turn off the shower. Dry off with a clean towel. Do not put anything on your skin afterward, such as powder, lotion, or perfume. Put on clean clothes or pajamas. If it's the night before surgery, sleep in clean sheets. General tips Use CHG only as told, and follow the instructions on the label. Use the full amount of CHG as told. This is often one bottle. Do not smoke and stay away from flames after using CHG. Your skin may feel sticky after using CHG. This is normal. The sticky feeling will go away as the CHG dries. Do not use CHG: If you have a chlorhexidine allergy or have reacted to chlorhexidine in the past. On open wounds or areas of skin that have broken skin, cuts, or scrapes. On babies younger than 42 months of age. Contact a health care provider if: You have questions about using CHG. Your skin gets irritated or itchy. You have a rash after using CHG. You swallow any CHG. Call your local poison control center [phone] in the [place] Your eyes itch badly, or they become very red or swollen. Your hearing changes. You have trouble seeing. If you can't reach  your provider, go to an urgent care or emergency room. Do not drive yourself. Get help right away if: You have swelling or tingling in your mouth or throat. You make high-pitched whistling sounds when you breathe, most often when you breathe out (wheeze). You have trouble breathing. These symptoms may be an emergency. Call 911 right away. Do not wait to see if the symptoms will go away. Do not drive yourself to the hospital. This information is not intended to replace advice given to you by your health care provider. Make sure you discuss any questions you have with your health care provider. Document Revised: 08/01/2022 Document Reviewed: 07/28/2021 Elsevier Patient Education  2024 [employer].How to Use an Incentive Spirometer An incentive spirometer is a tool that measures how well you are filling your lungs with each breath. Learning to take long, deep breaths using this tool can help you keep your lungs clear and active. This may help to reverse or lessen your chance of developing breathing (pulmonary) problems, especially infection. You may be asked to use a spirometer: After a surgery. If you have a lung problem or a history of smoking. After a long period of time when you have been unable to move or be active. If the spirometer includes an indicator to show the highest number that you have reached, your health care provider or respiratory therapist will help you set  a goal. Keep a log of your progress as told by your health care provider. What are the risks? Breathing too quickly may cause dizziness or cause you to pass out. Take your time so you do not get dizzy or light-headed. If you are in pain, you may need to take pain medicine before doing incentive spirometry. It is harder to take a deep breath if you are having pain. How to use your incentive spirometer  Sit up on the edge of your bed or on a chair. Hold the incentive spirometer so that it is in an upright position. Before  you use the spirometer, breathe out normally. Place the mouthpiece in your mouth. Make sure your lips are closed tightly around it. Breathe in slowly and as deeply as you can through your mouth, causing the piston or the ball to rise toward the top of the chamber. Hold your breath for 3-5 seconds, or for as long as possible. If the spirometer includes a coach indicator, use this to guide you in breathing. Slow down your breathing if the indicator goes above the marked areas. Remove the mouthpiece from your mouth and breathe out normally. The piston or ball will return to the bottom of the chamber. Rest for a few seconds, then repeat the steps 10 or more times. Take your time and take a few normal breaths between deep breaths so that you do not get dizzy or light-headed. Do this every 1-2 hours when you are awake. If the spirometer includes a goal marker to show the highest number you have reached (best effort), use this as a goal to work toward during each repetition. After each set of 10 deep breaths, cough a few times. This will help to make sure that your lungs are clear. If you have an incision on your chest or abdomen from surgery, place a pillow or a rolled-up towel firmly against the incision when you cough. This can help to reduce pain while taking deep breaths and coughing. General tips When you are able to get out of bed: Walk around often. Continue to take deep breaths and cough in order to clear your lungs. Keep using the incentive spirometer until your health care provider says it is okay to stop using it. If you have been in the hospital, you may be told to keep using the spirometer at home. Contact a health care provider if: You are having difficulty using the spirometer. You have trouble using the spirometer as often as instructed. Your pain medicine is not giving enough relief for you to use the spirometer as told. You have a fever. Get help right away if: You develop  shortness of breath. You develop a cough with bloody mucus from the lungs. You have fluid or blood coming from an incision site after you cough. Summary An incentive spirometer is a tool that can help you learn to take long, deep breaths to keep your lungs clear and active. You may be asked to use a spirometer after a surgery, if you have a lung problem or a history of smoking, or if you have been inactive for a long period of time. Use your incentive spirometer as instructed every 1-2 hours while you are awake. If you have an incision on your chest or abdomen, place a pillow or a rolled-up towel firmly against your incision when you cough. This will help to reduce pain. Get help right away if you have shortness of breath, you cough up bloody mucus, or  blood comes from your incision when you cough. This information is not intended to replace advice given to you by your health care provider. Make sure you discuss any questions you have with your health care provider. Document Revised: 11/24/2022 Document Reviewed: 11/24/2022 Elsevier Patient Education  2024 [employer]. "

## 2024-02-06 ENCOUNTER — Encounter (HOSPITAL_COMMUNITY)
Admission: RE | Admit: 2024-02-06 | Discharge: 2024-02-06 | Disposition: A | Discharge status: Home / Self Care | Source: Ambulatory Visit | Attending: Orthopedic Surgery | Admitting: Orthopedic Surgery

## 2024-02-06 ENCOUNTER — Encounter (HOSPITAL_COMMUNITY): Payer: Self-pay

## 2024-02-06 DIAGNOSIS — R011 Cardiac murmur, unspecified: Secondary | ICD-10-CM

## 2024-02-06 NOTE — Telephone Encounter (Signed)
 Spoke w/ pt and advised her of information from provider. Let pt know she is still scheduled for [facility] on 02/11/24. Pt is unsure if this date will work, she will call back to advise.

## 2024-02-06 NOTE — Pre-Procedure Instructions (Signed)
 Patient did not show for her pre-op. I called her and she had forgotten appointment. I gave her schedulers number to reschedule this.

## 2024-02-08 ENCOUNTER — Telehealth: Payer: Self-pay | Admitting: Orthopedic Surgery

## 2024-02-08 NOTE — Telephone Encounter (Signed)
 Dr. Onesimo pt - pt lvm stating she wants to reschedule her surgery [phone]

## 2024-02-11 ENCOUNTER — Encounter: Admission: RE | Payer: Self-pay

## 2024-02-11 ENCOUNTER — Ambulatory Visit (HOSPITAL_COMMUNITY): Admission: RE | Admit: 2024-02-11 | Admitting: Orthopedic Surgery

## 2024-02-11 ENCOUNTER — Telehealth: Payer: Self-pay | Admitting: Orthopedic Surgery

## 2024-02-11 SURGERY — CARPAL TUNNEL RELEASE
Anesthesia: Choice | Laterality: Left

## 2024-02-11 NOTE — Telephone Encounter (Signed)
 Dr. Onesimo pt - spoke w/the pt, she would like someone to call her and rs her surgery.  [phone]

## 2024-02-12 ENCOUNTER — Telehealth: Payer: Self-pay | Admitting: Orthopedic Surgery

## 2024-02-12 NOTE — Telephone Encounter (Signed)
 Pt would like to proceed with 02/28/24 for surgery. Please assist.

## 2024-02-12 NOTE — Telephone Encounter (Signed)
 Dr. Onesimo pt - pt lvm asking that someone please return her call, she would like to reschedule her surgery.  [phone]

## 2024-02-14 ENCOUNTER — Ambulatory Visit: Payer: Self-pay | Admitting: Orthopedic Surgery

## 2024-02-22 ENCOUNTER — Ambulatory Visit: Payer: Self-pay | Admitting: Orthopedic Surgery

## 2024-02-25 ENCOUNTER — Inpatient Hospital Stay (HOSPITAL_COMMUNITY): Admission: RE | Admit: 2024-02-25

## 2024-02-26 ENCOUNTER — Other Ambulatory Visit: Payer: Self-pay

## 2024-02-26 ENCOUNTER — Encounter (HOSPITAL_COMMUNITY): Payer: Self-pay

## 2024-02-26 NOTE — Pre-Procedure Instructions (Signed)
 Attempted pre-op phone call. VM full and could not leave a message.

## 2024-02-27 ENCOUNTER — Encounter (HOSPITAL_COMMUNITY)
Admission: RE | Admit: 2024-02-27 | Discharge: 2024-02-27 | Disposition: A | Discharge status: Home / Self Care | Source: Ambulatory Visit | Attending: Orthopedic Surgery | Admitting: Orthopedic Surgery

## 2024-02-28 ENCOUNTER — Ambulatory Visit (HOSPITAL_COMMUNITY): Payer: Self-pay

## 2024-02-28 ENCOUNTER — Encounter (HOSPITAL_COMMUNITY): Admission: RE | Disposition: A | Payer: Self-pay | Source: Ambulatory Visit | Attending: Orthopedic Surgery

## 2024-02-28 ENCOUNTER — Encounter (HOSPITAL_COMMUNITY): Payer: Self-pay | Admitting: Orthopedic Surgery

## 2024-02-28 ENCOUNTER — Ambulatory Visit (HOSPITAL_COMMUNITY)
Admission: RE | Admit: 2024-02-28 | Discharge: 2024-02-28 | Disposition: A | Discharge status: Home / Self Care | Source: Ambulatory Visit | Attending: Orthopedic Surgery | Admitting: Orthopedic Surgery

## 2024-02-28 DIAGNOSIS — G5602 Carpal tunnel syndrome, left upper limb: Secondary | ICD-10-CM | POA: Diagnosis not present

## 2024-02-28 DIAGNOSIS — I10 Essential (primary) hypertension: Secondary | ICD-10-CM | POA: Insufficient documentation

## 2024-02-28 DIAGNOSIS — G5603 Carpal tunnel syndrome, bilateral upper limbs: Secondary | ICD-10-CM | POA: Insufficient documentation

## 2024-02-28 MED ORDER — 0.9 % SODIUM CHLORIDE (POUR BTL) OPTIME
TOPICAL | Status: DC | PRN
  Administered 2024-02-28: 1000 mL

## 2024-02-28 MED ORDER — DEXAMETHASONE SOD PHOSPHATE PF 10 MG/ML IJ SOLN
INTRAMUSCULAR | Status: DC | PRN
  Administered 2024-02-28: 10 mg via INTRAVENOUS

## 2024-02-28 MED ORDER — ONDANSETRON HCL 4 MG/2ML IJ SOLN: 4.0000 mg | Freq: Once | INTRAMUSCULAR | Status: DC | PRN

## 2024-02-28 MED ORDER — MIDAZOLAM HCL 2 MG/2ML IJ SOLN
INTRAMUSCULAR | Status: AC
  Filled 2024-02-28: qty 2

## 2024-02-28 MED ORDER — HYDROCODONE-ACETAMINOPHEN 5-325 MG PO TABS: 1.0000 | ORAL_TABLET | Freq: Four times a day (QID) | ORAL | 0 refills | Status: AC | PRN

## 2024-02-28 MED ORDER — BUPIVACAINE HCL (PF) 0.5 % IJ SOLN
INTRAMUSCULAR | Status: AC
  Filled 2024-02-28: qty 30

## 2024-02-28 MED ORDER — OXYCODONE HCL 5 MG/5ML PO SOLN: 5.0000 mg | Freq: Once | ORAL | Status: DC | PRN

## 2024-02-28 MED ORDER — ONDANSETRON HCL 4 MG/2ML IJ SOLN
INTRAMUSCULAR | Status: DC | PRN
  Administered 2024-02-28: 4 mg via INTRAVENOUS

## 2024-02-28 MED ORDER — FENTANYL CITRATE (PF) 100 MCG/2ML IJ SOLN
INTRAMUSCULAR | Status: AC
  Filled 2024-02-28: qty 2

## 2024-02-28 MED ORDER — CHLORHEXIDINE GLUCONATE 0.12 % MT SOLN
15.0000 mL | Freq: Once | OROMUCOSAL | Status: AC
  Administered 2024-02-28: 15 mL via OROMUCOSAL

## 2024-02-28 MED ORDER — FENTANYL CITRATE (PF) 100 MCG/2ML IJ SOLN
INTRAMUSCULAR | Status: DC | PRN
  Administered 2024-02-28: 25 ug via INTRAVENOUS
  Administered 2024-02-28: 50 ug via INTRAVENOUS

## 2024-02-28 MED ORDER — ORAL CARE MOUTH RINSE: 15.0000 mL | Freq: Once | OROMUCOSAL | Status: AC

## 2024-02-28 MED ORDER — LIDOCAINE 2% (20 MG/ML) 5 ML SYRINGE
INTRAMUSCULAR | Status: AC
  Filled 2024-02-28: qty 5

## 2024-02-28 MED ORDER — BUPIVACAINE HCL (PF) 0.5 % IJ SOLN
INTRAMUSCULAR | Status: DC | PRN
  Administered 2024-02-28: 10 mL

## 2024-02-28 MED ORDER — LACTATED RINGERS IV SOLN: INTRAVENOUS | Status: DC

## 2024-02-28 MED ORDER — ONDANSETRON 4 MG PO TBDP: 4.0000 mg | ORAL_TABLET | Freq: Three times a day (TID) | ORAL | 0 refills | Status: AC | PRN

## 2024-02-28 MED ORDER — FENTANYL CITRATE (PF) 50 MCG/ML IJ SOSY: 25.0000 ug | PREFILLED_SYRINGE | INTRAMUSCULAR | Status: DC | PRN

## 2024-02-28 MED ORDER — CEFAZOLIN SODIUM-DEXTROSE 2-4 GM/100ML-% IV SOLN
2.0000 g | INTRAVENOUS | Status: AC
  Administered 2024-02-28: 2 g via INTRAVENOUS

## 2024-02-28 MED ORDER — LIDOCAINE HCL (PF) 0.5 % IJ SOLN
INTRAMUSCULAR | Status: AC
  Filled 2024-02-28: qty 50

## 2024-02-28 MED ORDER — DEXAMETHASONE SOD PHOSPHATE PF 10 MG/ML IJ SOLN
INTRAMUSCULAR | Status: AC
  Filled 2024-02-28: qty 1

## 2024-02-28 MED ORDER — PROPOFOL 10 MG/ML IV BOLUS
INTRAVENOUS | Status: DC | PRN
  Administered 2024-02-28: 80 mg via INTRAVENOUS
  Administered 2024-02-28: 150 ug/kg/min via INTRAVENOUS
  Administered 2024-02-28 (×2): 30 mg via INTRAVENOUS

## 2024-02-28 MED ORDER — LIDOCAINE HCL (PF) 1 % IJ SOLN
INTRAMUSCULAR | Status: AC
  Filled 2024-02-28: qty 30

## 2024-02-28 MED ORDER — PROPOFOL 10 MG/ML IV BOLUS
INTRAVENOUS | Status: AC
  Filled 2024-02-28: qty 20

## 2024-02-28 MED ORDER — ONDANSETRON HCL 4 MG/2ML IJ SOLN
INTRAMUSCULAR | Status: AC
  Filled 2024-02-28: qty 4

## 2024-02-28 MED ORDER — OXYCODONE HCL 5 MG PO TABS: 5.0000 mg | ORAL_TABLET | Freq: Once | ORAL | Status: DC | PRN

## 2024-02-28 MED ORDER — LIDOCAINE HCL 1 % IJ SOLN
INTRAMUSCULAR | Status: DC | PRN
  Administered 2024-02-28: 10 mL via INTRADERMAL

## 2024-02-28 MED ORDER — MIDAZOLAM HCL 5 MG/5ML IJ SOLN
INTRAMUSCULAR | Status: DC | PRN
  Administered 2024-02-28: 2 mg via INTRAVENOUS

## 2024-02-28 NOTE — Anesthesia Preprocedure Evaluation (Signed)
"                                    Anesthesia Evaluation  Patient identified by MRN, date of birth, ID band Patient awake    Reviewed: Allergy & Precautions, H&P , NPO status , Patient's Chart, lab work & pertinent test results, reviewed documented beta blocker date and time   History of Anesthesia Complications (+) PONV and history of anesthetic complications  Airway Mallampati: II  TM Distance: >3 FB Neck ROM: full    Dental no notable dental hx.    Pulmonary neg pulmonary ROS   Pulmonary exam normal breath sounds clear to auscultation       Cardiovascular Exercise Tolerance: Good hypertension, + Valvular Problems/Murmurs  Rhythm:regular Rate:Normal     Neuro/Psych negative neurological ROS  negative psych ROS   GI/Hepatic negative GI ROS, Neg liver ROS,,,  Endo/Other  negative endocrine ROS    Renal/GU negative Renal ROS  negative genitourinary   Musculoskeletal   Abdominal   Peds  Hematology negative hematology ROS (+)   Anesthesia Other Findings   Reproductive/Obstetrics negative OB ROS                              Anesthesia Physical Anesthesia Plan  ASA: 2  Anesthesia Plan: MAC   Post-op Pain Management:    Induction:   PONV Risk Score and Plan: Propofol  infusion  Airway Management Planned:   Additional Equipment:   Intra-op Plan:   Post-operative Plan:   Informed Consent: I have reviewed the patients History and Physical, chart, labs and discussed the procedure including the risks, benefits and alternatives for the proposed anesthesia with the patient or authorized representative who has indicated his/her understanding and acceptance.     Dental Advisory Given  Plan Discussed with: CRNA  Anesthesia Plan Comments:         Anesthesia Quick Evaluation  "

## 2024-02-28 NOTE — Op Note (Signed)
 Orthopaedic Surgery Operative Note (CSN: [id number])  Julie Vega  [birth year] Date of Surgery: 02/28/2024   Diagnoses:  Left carpal tunnel syndrome  Procedure: Left Open Carpal Tunnel Release   Operative Finding Successful completion of the planned procedure.    Post-Op Diagnosis: Same Surgeons:Primary: Onesimo Oneil LABOR, MD Assistants: None Location: [facility] OR ROOM 4 Anesthesia: Sedation plus regional anesthesia Antibiotics: Ancef  2 g Tourniquet time:  Total Tourniquet Time Documented: Upper Arm (Left) - 23 minutes Total: Upper Arm (Left) - 23 minutes  Estimated Blood Loss: 5 cc Complications: None Specimens: None  Implants: None  Indications for Surgery:   Julie Vega is a 54 y.o. female with symptoms consistent with carpal tunnel syndrome.  Symptoms have been ongoing, and progressively worsening.  They have tried medications and bracing without improvement in symptoms.  EMG results demonstrate moderate to severe carpal tunnel syndrome.  Risks and benefits of operative and nonoperative management were discussed prior to surgery with the patient and informed consent form was completed.  Specific risks including infection, need for additional surgery, bleeding, recurrent symptoms, incomplete resolution of symptoms, persistent pain and more severe complications associated with anesthesia were discussed.  All questions were answered.  Surgical consent was finalized.    Procedure:   The patient was identified properly. Informed consent was finalized and the surgical site was marked. The patient was taken to the OR where sedation was induced.  The patient was positioned supine, on a hand table.  The left arm was prepped and draped in the usual sterile fashion.  Timeout was performed before the beginning of the case.  Tourniquet was used for the above duration.  Antibiotics were administered prior to making incision.  Incision was made in line with the radial border of the ring  finger. The carpal tunnel transverse fascia was identified, cleaned, and incised sharply. The common sensory branches were visualized along with the superficial palmar arch and protected.  The median nerve was protected below. Deep retractors were placed underneath the transverse carpal ligament, protecting the nerve. I released the ligament completely, and then released the distal volar forearm fascia. The nerve was identified, and visualized, and protected throughout the case. No masses or abnormalities were identified in ulnar bursa.  The wounds were irrigated copiously, and the wounds injected. Skin closed with interrupted sutures followed by a bulky dressing. Patient  tolerated this well, with no complications.   Post-operative plan:  The patient will be discharged home from the PACU. WBAT on the operative extremity; limit lifting to nothing more than a coffee cup until follow up appointment   DVT prophylaxis not indicated in this ambulatory upper extremity patient without significant risk factors.    Pain control with PRN pain medication preferring oral medicines.   Follow up plan will be scheduled in approximately 7-10 days for incision check

## 2024-02-28 NOTE — Discharge Instructions (Signed)
  Brya Simerly A. Onesimo, MD MS [facility] [address] [place],  [place]  [address] Phone: [phone] Fax: [phone]    POST-OPERATIVE INSTRUCTIONS   WOUND CARE You may remove your bandage on postop day 3 and get the hand wet.  No ointments or lotions to be applied to the incision.  Do not submerge the incision for 1 month.  FOLLOW-UP If you develop a Fever (>101.5), Redness or Drainage from the surgical incision site, please call our office to arrange for an evaluation. Please call the office to schedule a follow-up appointment for your incision check if you do not already have one, 7-10 days post-operatively.  IF YOU HAVE ANY QUESTIONS, PLEASE FEEL FREE TO CALL OUR OFFICE.  HELPFUL INFORMATION  You should wean off your narcotic medicines as soon as you are able.  Most patients will be off or using minimal narcotics before their first postop appointment.   You may be more comfortable sleeping in a semi-seated position the first few nights following surgery.  Keep a pillow propped under the elbow and forearm for comfort.  If you have a recliner type of chair it might be beneficial.    We suggest you use the pain medication the first night prior to going to bed, in order to ease any pain when the anesthesia wears off. You should avoid taking pain medications on an empty stomach as it will make you nauseous.  Do not drink alcoholic beverages or take illicit drugs when taking pain medications.  You may return to work/school in the next couple of days when you feel up to it. Desk work and typing is fine.  Pain medication may make you constipated.  Below are a few solutions to try in this order: Decrease the amount of pain medication if you aren't having pain. Drink lots of decaffeinated fluids. Drink prune juice and/or each dried prunes  If the first 3 don't work start with additional solutions Take Colace - an over-the-counter stool softener Take  Senokot - an over-the-counter laxative Take Miralax  - a stronger over-the-counter laxative   Narcotic Management  Per [facility] clinic policy, our goal is ensure optimal postoperative pain control with a multimodal pain management strategy.   For all [facility] patients, our goal is to wean post-operative narcotic medications by 6 weeks post-operatively.  If this is not possible due to utilization of pain medication prior to surgery, your [facility] doctor will support your acute post-operative pain control for the first 6 weeks postoperatively, with a plan to transition you back to your primary pain team following that.   [facility] will work to ensure a Therapist, occupational.

## 2024-02-28 NOTE — Interval H&P Note (Signed)
 History and Physical Interval Note:  02/28/2024 11:01 AM  Julie Vega  has presented today for surgery, with the diagnosis of Left carpal tunnel syndrome.  The various methods of treatment have been discussed with the patient and family. After consideration of risks, benefits and other options for treatment, the patient has consented to  Procedures: CARPAL TUNNEL RELEASE (Left) as a surgical intervention.  The patient's history has been reviewed, patient examined, no change in status, stable for surgery.  I have reviewed the patient's chart and labs.  Questions were answered to the patient's satisfaction.     Oneil DELENA Horde

## 2024-02-28 NOTE — Transfer of Care (Cosign Needed)
 Immediate Anesthesia Transfer of Care Note  Patient: Julie Vega  Procedure(s) Performed: CARPAL TUNNEL RELEASE (Left: Hand)  Patient Location: Short Stay  Anesthesia Type:MAC  Level of Consciousness: awake, alert , oriented, and patient cooperative  Airway & Oxygen Therapy: Patient Spontanous Breathing and Patient connected to face mask oxygen  Post-op Assessment: Report given to RN, Post -op Vital signs reviewed and stable, and Patient moving all extremities  Post vital signs: Reviewed and stable  Last Vitals:  Vitals Value Taken Time  BP 107/68 02/28/24 11:56  Temp 36.5 C 02/28/24 11:56  Pulse 84 02/28/24 11:56  Resp 14 02/28/24 11:56  SpO2 100 % 02/28/24 11:56    Last Pain:  Vitals:   02/28/24 1156  TempSrc: Oral  PainSc: 0-No pain      Patients Stated Pain Goal: 6 (02/28/24 1156)  Complications: No notable events documented.

## 2024-02-29 ENCOUNTER — Encounter (HOSPITAL_COMMUNITY): Payer: Self-pay | Admitting: Orthopedic Surgery

## 2024-02-29 NOTE — Anesthesia Postprocedure Evaluation (Signed)
"   Anesthesia Post Note  Patient: Julie Vega  Procedure(s) Performed: CARPAL TUNNEL RELEASE (Left: Hand)  Patient location during evaluation: Phase II Anesthesia Type: MAC Level of consciousness: awake Pain management: pain level controlled Vital Signs Assessment: post-procedure vital signs reviewed and stable Respiratory status: spontaneous breathing and respiratory function stable Cardiovascular status: blood pressure returned to baseline and stable Postop Assessment: no headache and no apparent nausea or vomiting Anesthetic complications: no Comments: Late entry   No notable events documented.   Last Vitals:  Vitals:   02/28/24 1015 02/28/24 1156  BP: 128/78 107/68  Pulse:  84  Resp: 14 14  Temp: 36.6 C 36.5 C  SpO2: 100% 100%    Last Pain:  Vitals:   02/28/24 1156  TempSrc: Oral  PainSc: 0-No pain                 Yvonna PARAS Mildreth Reek      "

## 2024-03-07 ENCOUNTER — Encounter: Payer: Self-pay | Admitting: Orthopedic Surgery

## 2024-03-07 ENCOUNTER — Ambulatory Visit: Admitting: Orthopedic Surgery

## 2024-03-07 DIAGNOSIS — G5602 Carpal tunnel syndrome, left upper limb: Secondary | ICD-10-CM

## 2024-03-07 MED ORDER — HYDROCODONE-ACETAMINOPHEN 5-325 MG PO TABS: 1.0000 | ORAL_TABLET | Freq: Four times a day (QID) | ORAL | 0 refills | Status: AC | PRN

## 2024-03-07 NOTE — Patient Instructions (Signed)
Hand Exercises  Hand exercises can be helpful for almost anyone. These exercises can strengthen the hands, improve flexibility and movement, and increase blood flow to the hands. These results can make work and daily tasks easier. Hand exercises can be especially helpful for people who have joint pain from arthritis or have nerve damage from overuse (carpal tunnel syndrome). These exercises can also help people who have injured a hand.  Exercises Most of these hand exercises are gentle stretching and motion exercises. It is usually safe to do them often throughout the day. Warming up your hands before exercise may help to reduce stiffness. You can do this with gentle massage or by placing your hands in warm water for 10-15 minutes. It is normal to feel some stretching, pulling, tightness, or mild discomfort as you begin new exercises. This will gradually improve. Stop an exercise right away if you feel sudden, severe pain or your pain gets worse. Ask your health care provider which exercises are best for you. Knuckle bend or "claw" fist Stand or sit with your arm, hand, and all five fingers pointed straight up. Make sure to keep your wrist straight during the exercise. Gently bend your fingers down toward your palm until the tips of your fingers are touching the top of your palm. Keep your big knuckle straight and just bend the small knuckles in your fingers. Hold this position for 10 seconds. Straighten (extend) your fingers back to the starting position. Repeat this exercise 5-10 times with each hand. Full finger fist Stand or sit with your arm, hand, and all five fingers pointed straight up. Make sure to keep your wrist straight during the exercise. Gently bend your fingers into your palm until the tips of your fingers are touching the middle of your palm. Hold this position for 10 seconds. Extend your fingers back to the starting position, stretching every joint fully. Repeat this exercise  5-10 times with each hand. Straight fist Stand or sit with your arm, hand, and all five fingers pointed straight up. Make sure to keep your wrist straight during the exercise. Gently bend your fingers at the big knuckle, where your fingers meet your hand, and the middle knuckle. Keep the knuckle at the tips of your fingers straight and try to touch the bottom of your palm. Hold this position for 10 seconds. Extend your fingers back to the starting position, stretching every joint fully. Repeat this exercise 5-10 times with each hand. Tabletop Stand or sit with your arm, hand, and all five fingers pointed straight up. Make sure to keep your wrist straight during the exercise. Gently bend your fingers at the big knuckle, where your fingers meet your hand, as far down as you can while keeping the small knuckles in your fingers straight. Think of forming a tabletop with your fingers. Hold this position for 10 seconds. Extend your fingers back to the starting position, stretching every joint fully. Repeat this exercise 5-10 times with each hand. Finger spread Place your hand flat on a table with your palm facing down. Make sure your wrist stays straight as you do this exercise. Spread your fingers and thumb apart from each other as far as you can until you feel a gentle stretch. Hold this position for 10 seconds. Bring your fingers and thumb tight together again. Hold this position for 10 seconds. Repeat this exercise 5-10 times with each hand. Making circles Stand or sit with your arm, hand, and all five fingers pointed straight up. Make   sure to keep your wrist straight during the exercise. Make a circle by touching the tip of your thumb to the tip of your index finger. Hold for 10 seconds. Then open your hand wide. Repeat this motion with your thumb and each finger on your hand. Repeat this exercise 5-10 times with each hand. Thumb motion Sit with your forearm resting on a table and your wrist  straight. Your thumb should be facing up toward the ceiling. Keep your fingers relaxed as you move your thumb. Lift your thumb up as high as you can toward the ceiling. Hold for 10 seconds. Bend your thumb across your palm as far as you can, reaching the tip of your thumb for the small finger (pinkie) side of your palm. Hold for 10 seconds. Repeat this exercise 5-10 times with each hand. Grip strengthening Hold a stress ball or other soft ball in the middle of your hand. Slowly increase the pressure, squeezing the ball as much as you can without causing pain. Think of bringing the tips of your fingers into the middle of your palm. All of your finger joints should bend when doing this exercise. Hold your squeeze for 10 seconds, then relax. Repeat this exercise 5-10 times with each hand.   Contact a health care provider if: Your hand pain or discomfort gets much worse when you do an exercise. Your hand pain or discomfort does not improve within 2 hours after you exercise. If you have any of these problems, stop doing these exercises right away. Do not do them again unless your health care provider says that you can.    Get help right away if: You develop sudden, severe hand pain or swelling. If this happens, stop doing these exercises right away. Do not do them again unless your health care provider says that you can. This information is not intended to replace advice given to you by your health care provider. Make sure you discuss any questions you have with your health care provider.  

## 2024-03-07 NOTE — Progress Notes (Signed)
 Orthopaedic Postop Note  Assessment: Julie Vega is a 54 y.o. female s/p left open carpal tunnel release  DOS: 02/28/2024  Plan: Julie Vega has done well.  Denies burning or shooting pains.  Some persistent tingling sensations in the median nerve distribution.  Surgical incision is healing well.  Sutures were removed, and Steri-Strips were placed.  Okay to return to work.  Keep incision covered.  Do not submerge the wound.  Limited prescription for hydrocodone  provided.  Return in 4 weeks.    Follow-up: Return in about 4 weeks (around 04/04/2024). XR at next visit: None  Subjective:  Chief Complaint  Patient presents with   Routine Post Op    Left CTR DOS 02/28/24 little pain and weakness has forms she needs filled out forgot to bring them. They are faxing them.   Medication Refill    Hydrocodone  5/325mg  has one more pill left    History of Present Illness: Julie Vega is a 54 y.o. female who presents following the above stated procedure.  Surgery was approximately 2 weeks ago.  No issues.  Some pain for the first few days.  Burning and shooting pains have almost completely resolved.  Some numbness in the fingers.   No issues with the incision  Review of Systems: No fevers or chills Some numbness or tingling No Chest Pain No shortness of breath   Objective: LMP 11/09/2011   Physical Exam:  Alert and oriented, no acute distress.  Surgical incision is healing well.  No surrounding erythema or drainage.  Mild tenderness to palpation about surgical site.  Sensation is intact in the median nerve distribution.  Stiffness in fingers.  Short of a full fist. No redness. 2+ radial pulse.   IMAGING: I personally ordered and reviewed the following images:  No new imaging obtained today  Julie DELENA Horde, MD 03/07/2024 11:37 AM

## 2024-04-08 ENCOUNTER — Encounter: Admitting: Orthopedic Surgery
